# Patient Record
Sex: Female | Born: 1950 | Race: Black or African American | Hispanic: No | Marital: Married | Smoking: Never smoker
Health system: Southern US, Community
[De-identification: ages and names within clinical notes are randomized; demographics above are authoritative.]

## PROBLEM LIST (undated history)

## (undated) DIAGNOSIS — E7800 Pure hypercholesterolemia, unspecified: Secondary | ICD-10-CM

## (undated) DIAGNOSIS — D649 Anemia, unspecified: Secondary | ICD-10-CM

## (undated) DIAGNOSIS — R569 Unspecified convulsions: Secondary | ICD-10-CM

## (undated) DIAGNOSIS — K219 Gastro-esophageal reflux disease without esophagitis: Secondary | ICD-10-CM

## (undated) DIAGNOSIS — I10 Essential (primary) hypertension: Secondary | ICD-10-CM

## (undated) DIAGNOSIS — K759 Inflammatory liver disease, unspecified: Secondary | ICD-10-CM

## (undated) DIAGNOSIS — R51 Headache: Secondary | ICD-10-CM

## (undated) DIAGNOSIS — M1990 Unspecified osteoarthritis, unspecified site: Secondary | ICD-10-CM

## (undated) DIAGNOSIS — M75100 Unspecified rotator cuff tear or rupture of unspecified shoulder, not specified as traumatic: Secondary | ICD-10-CM

## (undated) DIAGNOSIS — E039 Hypothyroidism, unspecified: Secondary | ICD-10-CM

## (undated) HISTORY — PX: ABDOMINAL HYSTERECTOMY: SHX81

## (undated) HISTORY — PX: KNEE ARTHROSCOPY: SHX127

---

## 2013-11-01 ENCOUNTER — Encounter (HOSPITAL_COMMUNITY)
Admission: RE | Admit: 2013-11-01 | Discharge: 2013-11-01 | Disposition: A | Discharge status: Home / Self Care | Payer: Medicaid Other | Source: Ambulatory Visit | Attending: Orthopedic Surgery | Admitting: Orthopedic Surgery

## 2013-11-01 ENCOUNTER — Encounter (HOSPITAL_COMMUNITY): Payer: Self-pay

## 2013-11-01 ENCOUNTER — Ambulatory Visit (HOSPITAL_COMMUNITY)
Admission: RE | Admit: 2013-11-01 | Discharge: 2013-11-01 | Disposition: A | Discharge status: Home / Self Care | Payer: Medicaid Other | Source: Ambulatory Visit | Attending: Surgical | Admitting: Surgical

## 2013-11-01 ENCOUNTER — Encounter (HOSPITAL_COMMUNITY): Payer: Self-pay | Admitting: Pharmacy Technician

## 2013-11-01 ENCOUNTER — Encounter (INDEPENDENT_AMBULATORY_CARE_PROVIDER_SITE_OTHER): Payer: Self-pay

## 2013-11-01 DIAGNOSIS — I10 Essential (primary) hypertension: Secondary | ICD-10-CM | POA: Insufficient documentation

## 2013-11-01 DIAGNOSIS — Z01818 Encounter for other preprocedural examination: Secondary | ICD-10-CM | POA: Insufficient documentation

## 2013-11-01 DIAGNOSIS — Z01810 Encounter for preprocedural cardiovascular examination: Secondary | ICD-10-CM | POA: Insufficient documentation

## 2013-11-01 DIAGNOSIS — Z01812 Encounter for preprocedural laboratory examination: Secondary | ICD-10-CM | POA: Insufficient documentation

## 2013-11-01 HISTORY — DX: Anemia, unspecified: D64.9

## 2013-11-01 HISTORY — DX: Essential (primary) hypertension: I10

## 2013-11-01 HISTORY — DX: Gastro-esophageal reflux disease without esophagitis: K21.9

## 2013-11-01 HISTORY — DX: Unspecified convulsions: R56.9

## 2013-11-01 HISTORY — DX: Inflammatory liver disease, unspecified: K75.9

## 2013-11-01 HISTORY — DX: Unspecified osteoarthritis, unspecified site: M19.90

## 2013-11-01 HISTORY — DX: Unspecified rotator cuff tear or rupture of unspecified shoulder, not specified as traumatic: M75.100

## 2013-11-01 LAB — URINE MICROSCOPIC-ADD ON

## 2013-11-01 LAB — APTT: aPTT: 31 seconds (ref 24–37)

## 2013-11-01 LAB — COMPREHENSIVE METABOLIC PANEL
ALT: 17 U/L (ref 0–35)
AST: 37 U/L (ref 0–37)
Albumin: 3.7 g/dL (ref 3.5–5.2)
Alkaline Phosphatase: 99 U/L (ref 39–117)
BUN: 7 mg/dL (ref 6–23)
CO2: 28 mEq/L (ref 19–32)
Calcium: 10 mg/dL (ref 8.4–10.5)
Chloride: 98 mEq/L (ref 96–112)
Creatinine, Ser: 0.65 mg/dL (ref 0.50–1.10)
GFR calc Af Amer: >90 mL/min (ref >90)
GFR calc non Af Amer: >90 mL/min (ref >90)
Glucose, Bld: 114 mg/dL — ABNORMAL HIGH (ref 70–99)
Potassium: 2.9 mEq/L — ABNORMAL LOW (ref 3.5–5.1)
Sodium: 138 mEq/L (ref 135–145)
Total Bilirubin: 0.4 mg/dL (ref 0.3–1.2)
Total Protein: 8.2 g/dL (ref 6.0–8.3)

## 2013-11-01 LAB — URINALYSIS, ROUTINE W REFLEX MICROSCOPIC
Bilirubin Urine: NEGATIVE
Glucose, UA: NEGATIVE mg/dL
Ketones, ur: NEGATIVE mg/dL
Nitrite: NEGATIVE
Protein, ur: NEGATIVE mg/dL
Specific Gravity, Urine: 1.026 (ref 1.005–1.030)
Urobilinogen, UA: 1 mg/dL (ref 0.0–1.0)
pH: 6 (ref 5.0–8.0)

## 2013-11-01 LAB — CBC
HCT: 43.3 % (ref 36.0–46.0)
MCV: 98 fL (ref 78.0–100.0)
Platelets: 255 10*3/uL (ref 150–400)
RDW: 12.9 % (ref 11.5–15.5)
WBC: 10.8 10*3/uL — ABNORMAL HIGH (ref 4.0–10.5)

## 2013-11-01 LAB — PROTIME-INR
INR: 0.97 (ref 0.00–1.49)
Prothrombin Time: 12.7 seconds (ref 11.6–15.2)

## 2013-11-01 NOTE — Patient Instructions (Addendum)
YOUR SURGERY IS SCHEDULED AT [facility]  ON:  Wednesday  12/3  REPORT TO  SHORT STAY CENTER AT:   12:30 PM      PHONE # FOR SHORT STAY IS [phone]  DO NOT EAT  ANYTHING AFTER MIDNIGHT THE NIGHT BEFORE YOUR SURGERY.   NO FOOD, NO CHEWING GUM, NO MINTS, NO CANDIES, NO CHEWING TOBACCO. YOU MAY HAVE CLEAR LIQUIDS TO DRINK FROM MIDNIGHT UNTIL 8:30 AM THE MORNING OF SURGERY - LIKE WATER AND SODA.   NOTHING TO DRINK AFTER 8:30 AM  PLEASE TAKE THE FOLLOWING MEDICATIONS THE AM OF YOUR SURGERY WITH A FEW SIPS OF WATER:  AMLODIPINE    DO NOT BRING VALUABLES, MONEY, CREDIT CARDS.  DO NOT WEAR JEWELRY, MAKE-UP, NAIL POLISH AND NO METAL PINS OR CLIPS IN YOUR HAIR. CONTACT LENS, DENTURES / PARTIALS, GLASSES SHOULD NOT BE WORN TO SURGERY AND IN MOST CASES-HEARING AIDS WILL NEED TO BE REMOVED.  BRING YOUR GLASSES CASE, ANY EQUIPMENT NEEDED FOR YOUR CONTACT LENS. FOR PATIENTS ADMITTED TO THE HOSPITAL--CHECK OUT TIME THE DAY OF DISCHARGE IS 11:00 AM.  ALL INPATIENT ROOMS ARE PRIVATE - WITH BATHROOM, TELEPHONE, TELEVISION AND WIFI INTERNET.                                                     PLEASE READ OVER ANY  FACT SHEETS THAT YOU WERE GIVEN: MRSA INFORMATION, BLOOD TRANSFUSION INFORMATION, INCENTIVE SPIROMETER INFORMATION.  FAILURE TO FOLLOW THESE INSTRUCTIONS MAY RESULT IN THE CANCELLATION OF YOUR SURGERY. PLEASE BE AWARE THAT YOU MAY NEED ADDITIONAL BLOOD DRAWN DAY OF YOUR SURGERY  PATIENT SIGNATURE_________________________________

## 2013-11-01 NOTE — Pre-Procedure Instructions (Signed)
EKG AND CXR WERE DONE TODAY PREOP AT [facility] PT'S CMET  AND UA AND URINALYSIS AND URINE CULTURE IN PROCESS REPORTS WERE FAXED TO DR. Jeannetta Ellis OFFICE FOR REVIEW OF ABNORMALS -AND WENDY AT THE OFFICE NOTIFIED BY VOICE MAIL OF FAXED LABS AND POTASSIUM OF 2.9.

## 2013-11-02 LAB — URINE CULTURE: Colony Count: 80000

## 2013-11-02 NOTE — Pre-Procedure Instructions (Signed)
URINE CULTURE REPORT BACK - FAXED TO DR. GIOFFRE'S OFFICE.

## 2013-11-02 NOTE — Pre-Procedure Instructions (Signed)
PT CAME FOR HER PREOP VISIT YESTERDAY  11/15 - BUT WAS 45 MIN LATE FOR HER APPOINTMENT - I DID NOT HAVE TIME TO COMPLETE HER HISTORY / ASSESSMENT -JUST HER INSTRUCTIONS/ MEDS/ MEDICAL HISTORY AND OR CONSENT AND SHE GOT ALL OF HER PREOP LABS DONE.  I CALLED HER ON THE PHONE TODAY AND COMPLETED HER ASSESSMENT.

## 2013-11-09 ENCOUNTER — Ambulatory Visit (HOSPITAL_COMMUNITY): Payer: Medicaid Other | Admitting: Certified Registered Nurse Anesthetist

## 2013-11-09 ENCOUNTER — Encounter (HOSPITAL_COMMUNITY)
Admission: RE | Disposition: A | Discharge status: Home / Self Care | Payer: Self-pay | Source: Ambulatory Visit | Attending: Orthopedic Surgery

## 2013-11-09 ENCOUNTER — Encounter (HOSPITAL_COMMUNITY): Payer: Self-pay | Admitting: *Deleted

## 2013-11-09 ENCOUNTER — Encounter (HOSPITAL_COMMUNITY): Payer: Medicaid Other | Admitting: Certified Registered Nurse Anesthetist

## 2013-11-09 ENCOUNTER — Observation Stay (HOSPITAL_COMMUNITY)
Admission: RE | Admit: 2013-11-09 | Discharge: 2013-11-10 | Disposition: A | Discharge status: Home / Self Care | Payer: Medicaid Other | Source: Ambulatory Visit | Attending: Orthopedic Surgery | Admitting: Orthopedic Surgery

## 2013-11-09 DIAGNOSIS — Z79899 Other long term (current) drug therapy: Secondary | ICD-10-CM | POA: Insufficient documentation

## 2013-11-09 DIAGNOSIS — F17200 Nicotine dependence, unspecified, uncomplicated: Secondary | ICD-10-CM | POA: Insufficient documentation

## 2013-11-09 DIAGNOSIS — S43429A Sprain of unspecified rotator cuff capsule, initial encounter: Secondary | ICD-10-CM

## 2013-11-09 DIAGNOSIS — K219 Gastro-esophageal reflux disease without esophagitis: Secondary | ICD-10-CM | POA: Insufficient documentation

## 2013-11-09 DIAGNOSIS — M75120 Complete rotator cuff tear or rupture of unspecified shoulder, not specified as traumatic: Secondary | ICD-10-CM | POA: Diagnosis present

## 2013-11-09 DIAGNOSIS — K759 Inflammatory liver disease, unspecified: Secondary | ICD-10-CM | POA: Insufficient documentation

## 2013-11-09 DIAGNOSIS — M7500 Adhesive capsulitis of unspecified shoulder: Principal | ICD-10-CM | POA: Insufficient documentation

## 2013-11-09 DIAGNOSIS — Y9229 Other specified public building as the place of occurrence of the external cause: Secondary | ICD-10-CM | POA: Insufficient documentation

## 2013-11-09 DIAGNOSIS — Z9889 Other specified postprocedural states: Secondary | ICD-10-CM

## 2013-11-09 DIAGNOSIS — R296 Repeated falls: Secondary | ICD-10-CM | POA: Insufficient documentation

## 2013-11-09 DIAGNOSIS — I10 Essential (primary) hypertension: Secondary | ICD-10-CM | POA: Insufficient documentation

## 2013-11-09 HISTORY — PX: SHOULDER OPEN ROTATOR CUFF REPAIR: SHX2407

## 2013-11-09 LAB — POTASSIUM: Potassium: 3.1 meq/L — ABNORMAL LOW (ref 3.5–5.1)

## 2013-11-09 SURGERY — REPAIR, ROTATOR CUFF, OPEN
Anesthesia: General | Site: Shoulder | Laterality: Right

## 2013-11-09 MED ORDER — NEOSTIGMINE METHYLSULFATE 1 MG/ML IJ SOLN
INTRAMUSCULAR | Status: DC | PRN
  Administered 2013-11-09: 3 mg via INTRAVENOUS

## 2013-11-09 MED ORDER — DEXAMETHASONE SODIUM PHOSPHATE 10 MG/ML IJ SOLN
INTRAMUSCULAR | Status: AC
  Filled 2013-11-09: qty 1

## 2013-11-09 MED ORDER — 0.9 % SODIUM CHLORIDE (POUR BTL) OPTIME
TOPICAL | Status: DC | PRN
  Administered 2013-11-09: 1000 mL

## 2013-11-09 MED ORDER — LIDOCAINE HCL (CARDIAC) 20 MG/ML IV SOLN
INTRAVENOUS | Status: AC
  Filled 2013-11-09: qty 5

## 2013-11-09 MED ORDER — ACETAMINOPHEN 650 MG RE SUPP: 650.0000 mg | Freq: Four times a day (QID) | RECTAL | Status: DC | PRN

## 2013-11-09 MED ORDER — METHOCARBAMOL 500 MG PO TABS
500.0000 mg | ORAL_TABLET | Freq: Four times a day (QID) | ORAL | Status: DC | PRN
  Administered 2013-11-10: 13:00:00 500 mg via ORAL
  Filled 2013-11-09: qty 1

## 2013-11-09 MED ORDER — NEOSTIGMINE METHYLSULFATE 1 MG/ML IJ SOLN
INTRAMUSCULAR | Status: AC
  Filled 2013-11-09: qty 10

## 2013-11-09 MED ORDER — MENTHOL 3 MG MT LOZG: 1.0000 | LOZENGE | OROMUCOSAL | Status: DC | PRN

## 2013-11-09 MED ORDER — MIDAZOLAM HCL 2 MG/2ML IJ SOLN
INTRAMUSCULAR | Status: AC
  Filled 2013-11-09: qty 2

## 2013-11-09 MED ORDER — LIDOCAINE HCL (CARDIAC) 20 MG/ML IV SOLN
INTRAVENOUS | Status: DC | PRN
  Administered 2013-11-09: 50 mg via INTRAVENOUS

## 2013-11-09 MED ORDER — ROCURONIUM BROMIDE 100 MG/10ML IV SOLN
INTRAVENOUS | Status: AC
  Filled 2013-11-09: qty 1

## 2013-11-09 MED ORDER — PHENOL 1.4 % MT LIQD: 1.0000 | OROMUCOSAL | Status: DC | PRN

## 2013-11-09 MED ORDER — SODIUM CHLORIDE 0.9 % IR SOLN
Status: DC | PRN
  Administered 2013-11-09: 16:00:00

## 2013-11-09 MED ORDER — LABETALOL HCL 5 MG/ML IV SOLN
INTRAVENOUS | Status: AC
  Filled 2013-11-09: qty 4

## 2013-11-09 MED ORDER — PHENYLEPHRINE 40 MCG/ML (10ML) SYRINGE FOR IV PUSH (FOR BLOOD PRESSURE SUPPORT)
PREFILLED_SYRINGE | INTRAVENOUS | Status: AC
  Filled 2013-11-09: qty 10

## 2013-11-09 MED ORDER — THROMBIN 5000 UNITS EX SOLR
CUTANEOUS | Status: AC
  Filled 2013-11-09: qty 10000

## 2013-11-09 MED ORDER — BISACODYL 10 MG RE SUPP: 10.0000 mg | Freq: Every day | RECTAL | Status: DC | PRN

## 2013-11-09 MED ORDER — LACTATED RINGERS IV SOLN
INTRAVENOUS | Status: DC
  Administered 2013-11-09: 1000 mL via INTRAVENOUS

## 2013-11-09 MED ORDER — HYDROMORPHONE HCL PF 1 MG/ML IJ SOLN
INTRAMUSCULAR | Status: DC | PRN
  Administered 2013-11-09: 1 mg via INTRAVENOUS
  Administered 2013-11-09 (×3): 0.5 mg via INTRAVENOUS

## 2013-11-09 MED ORDER — LACTATED RINGERS IV SOLN
INTRAVENOUS | Status: DC
  Administered 2013-11-09: 19:00:00 100 mL/h via INTRAVENOUS
  Administered 2013-11-10: via INTRAVENOUS

## 2013-11-09 MED ORDER — FENTANYL CITRATE 0.05 MG/ML IJ SOLN
INTRAMUSCULAR | Status: AC
  Filled 2013-11-09: qty 5

## 2013-11-09 MED ORDER — FENTANYL CITRATE 0.05 MG/ML IJ SOLN
INTRAMUSCULAR | Status: DC | PRN
  Administered 2013-11-09: 75 ug via INTRAVENOUS
  Administered 2013-11-09: 50 ug via INTRAVENOUS
  Administered 2013-11-09: 100 ug via INTRAVENOUS
  Administered 2013-11-09: 25 ug via INTRAVENOUS

## 2013-11-09 MED ORDER — ONDANSETRON HCL 4 MG/2ML IJ SOLN
INTRAMUSCULAR | Status: DC | PRN
  Administered 2013-11-09 (×2): 2 mg via INTRAVENOUS

## 2013-11-09 MED ORDER — ONDANSETRON HCL 4 MG PO TABS: 4.0000 mg | ORAL_TABLET | Freq: Four times a day (QID) | ORAL | Status: DC | PRN

## 2013-11-09 MED ORDER — GLYCOPYRROLATE 0.2 MG/ML IJ SOLN
INTRAMUSCULAR | Status: AC
  Filled 2013-11-09: qty 3

## 2013-11-09 MED ORDER — GLYCOPYRROLATE 0.2 MG/ML IJ SOLN
INTRAMUSCULAR | Status: DC | PRN
  Administered 2013-11-09: 0.4 mg via INTRAVENOUS

## 2013-11-09 MED ORDER — SUCCINYLCHOLINE CHLORIDE 20 MG/ML IJ SOLN
INTRAMUSCULAR | Status: DC | PRN
  Administered 2013-11-09: 100 mg via INTRAVENOUS

## 2013-11-09 MED ORDER — PROPOFOL 10 MG/ML IV BOLUS
INTRAVENOUS | Status: DC | PRN
  Administered 2013-11-09: 200 mg via INTRAVENOUS

## 2013-11-09 MED ORDER — POLYETHYLENE GLYCOL 3350 17 G PO PACK: 17.0000 g | PACK | Freq: Every day | ORAL | Status: DC | PRN

## 2013-11-09 MED ORDER — ONDANSETRON HCL 4 MG/2ML IJ SOLN: 4.0000 mg | Freq: Four times a day (QID) | INTRAMUSCULAR | Status: DC | PRN

## 2013-11-09 MED ORDER — ONDANSETRON HCL 4 MG/2ML IJ SOLN
INTRAMUSCULAR | Status: AC
  Filled 2013-11-09: qty 4

## 2013-11-09 MED ORDER — ACETAMINOPHEN 325 MG PO TABS: 650.0000 mg | ORAL_TABLET | Freq: Four times a day (QID) | ORAL | Status: DC | PRN

## 2013-11-09 MED ORDER — CEFAZOLIN SODIUM-DEXTROSE 2-3 GM-% IV SOLR
INTRAVENOUS | Status: AC
  Filled 2013-11-09: qty 50

## 2013-11-09 MED ORDER — BUPIVACAINE LIPOSOME 1.3 % IJ SUSP
20.0000 mL | Freq: Once | INTRAMUSCULAR | Status: AC
  Administered 2013-11-09: 20 mL
  Filled 2013-11-09: qty 20

## 2013-11-09 MED ORDER — CEFAZOLIN SODIUM-DEXTROSE 2-3 GM-% IV SOLR
2.0000 g | INTRAVENOUS | Status: AC
  Administered 2013-11-09: 2 g via INTRAVENOUS

## 2013-11-09 MED ORDER — THROMBIN 5000 UNITS EX SOLR
CUTANEOUS | Status: DC | PRN
  Administered 2013-11-09: 10000 [IU] via TOPICAL

## 2013-11-09 MED ORDER — GLYCOPYRROLATE 0.2 MG/ML IJ SOLN
INTRAMUSCULAR | Status: AC
  Filled 2013-11-09: qty 1

## 2013-11-09 MED ORDER — FLEET ENEMA 7-19 GM/118ML RE ENEM: 1.0000 | ENEMA | Freq: Once | RECTAL | Status: AC | PRN

## 2013-11-09 MED ORDER — MIDAZOLAM HCL 5 MG/5ML IJ SOLN
INTRAMUSCULAR | Status: DC | PRN
  Administered 2013-11-09: 1 mg via INTRAVENOUS
  Administered 2013-11-09: 0.5 mg via INTRAVENOUS
  Administered 2013-11-09: 1 mg via INTRAVENOUS
  Administered 2013-11-09: 0.5 mg via INTRAVENOUS
  Administered 2013-11-09 (×2): 2 mg via INTRAVENOUS
  Administered 2013-11-09: 1 mg via INTRAVENOUS

## 2013-11-09 MED ORDER — PROPOFOL 10 MG/ML IV BOLUS
INTRAVENOUS | Status: AC
  Filled 2013-11-09: qty 20

## 2013-11-09 MED ORDER — HYDROMORPHONE HCL PF 1 MG/ML IJ SOLN
INTRAMUSCULAR | Status: AC
  Administered 2013-11-09: 0.25 mg via INTRAVENOUS
  Filled 2013-11-09: qty 1

## 2013-11-09 MED ORDER — ONDANSETRON HCL 4 MG/2ML IJ SOLN
INTRAMUSCULAR | Status: AC
  Filled 2013-11-09: qty 2

## 2013-11-09 MED ORDER — LACTATED RINGERS IV SOLN: INTRAVENOUS | Status: DC

## 2013-11-09 MED ORDER — HYDROCODONE-ACETAMINOPHEN 5-325 MG PO TABS
1.0000 | ORAL_TABLET | ORAL | Status: DC | PRN
  Administered 2013-11-09 (×2): 1 via ORAL
  Administered 2013-11-10 (×3): 2 via ORAL
  Filled 2013-11-09 (×3): qty 2
  Filled 2013-11-09 (×2): qty 1

## 2013-11-09 MED ORDER — OXYCODONE-ACETAMINOPHEN 5-325 MG PO TABS: 1.0000 | ORAL_TABLET | ORAL | Status: DC | PRN

## 2013-11-09 MED ORDER — DEXAMETHASONE SODIUM PHOSPHATE 4 MG/ML IJ SOLN
INTRAMUSCULAR | Status: DC | PRN
  Administered 2013-11-09: 10 mg via INTRAVENOUS

## 2013-11-09 MED ORDER — HYDROMORPHONE HCL PF 2 MG/ML IJ SOLN
INTRAMUSCULAR | Status: AC
  Filled 2013-11-09: qty 1

## 2013-11-09 MED ORDER — LABETALOL HCL 5 MG/ML IV SOLN
INTRAVENOUS | Status: DC | PRN
  Administered 2013-11-09: 5 mg via INTRAVENOUS

## 2013-11-09 MED ORDER — ROCURONIUM BROMIDE 100 MG/10ML IV SOLN
INTRAVENOUS | Status: DC | PRN
  Administered 2013-11-09: 30 mg via INTRAVENOUS

## 2013-11-09 MED ORDER — SUCCINYLCHOLINE CHLORIDE 20 MG/ML IJ SOLN
INTRAMUSCULAR | Status: AC
  Filled 2013-11-09: qty 1

## 2013-11-09 MED ORDER — HYDROMORPHONE HCL PF 1 MG/ML IJ SOLN
0.5000 mg | INTRAMUSCULAR | Status: DC | PRN
  Administered 2013-11-09: 0.25 mg via INTRAVENOUS
  Administered 2013-11-09: 0.5 mg via INTRAVENOUS
  Administered 2013-11-10: 01:00:00 1 mg via INTRAVENOUS
  Filled 2013-11-09 (×3): qty 1

## 2013-11-09 MED ORDER — PHENYLEPHRINE HCL 10 MG/ML IJ SOLN
INTRAMUSCULAR | Status: DC | PRN
  Administered 2013-11-09: 40 ug via INTRAVENOUS

## 2013-11-09 MED ORDER — AMLODIPINE BESYLATE 5 MG PO TABS
5.0000 mg | ORAL_TABLET | Freq: Every day | ORAL | Status: DC
Start: 2013-11-10 — End: 2013-11-10
  Administered 2013-11-10: 09:00:00 5 mg via ORAL
  Filled 2013-11-09 (×2): qty 1

## 2013-11-09 MED ORDER — LACTATED RINGERS IV SOLN
INTRAVENOUS | Status: DC | PRN
  Administered 2013-11-09 (×2): via INTRAVENOUS

## 2013-11-09 MED ORDER — CEFAZOLIN SODIUM 1-5 GM-% IV SOLN
1.0000 g | Freq: Four times a day (QID) | INTRAVENOUS | Status: AC
  Administered 2013-11-09 – 2013-11-10 (×3): 1 g via INTRAVENOUS
  Filled 2013-11-09 (×3): qty 50

## 2013-11-09 MED ORDER — METHOCARBAMOL 100 MG/ML IJ SOLN
500.0000 mg | Freq: Four times a day (QID) | INTRAVENOUS | Status: DC | PRN
  Administered 2013-11-09 – 2013-11-10 (×2): 500 mg via INTRAVENOUS
  Filled 2013-11-09 (×2): qty 5

## 2013-11-09 MED ORDER — HYDROMORPHONE HCL PF 1 MG/ML IJ SOLN
0.2500 mg | INTRAMUSCULAR | Status: DC | PRN
  Administered 2013-11-09 (×3): 0.25 mg via INTRAVENOUS

## 2013-11-09 SURGICAL SUPPLY — 45 items
ANCHOR PEEK ZIP 5.5 NDL NO2 (Orthopedic Implant) ×4 IMPLANT
BAG ZIPLOCK 12X15 (MISCELLANEOUS) ×2 IMPLANT
BLADE OSCILLATING/SAGITTAL (BLADE) ×1
BLADE SW THK.38XMED LNG THN (BLADE) ×1 IMPLANT
BNDG COHESIVE 6X5 TAN NS LF (GAUZE/BANDAGES/DRESSINGS) IMPLANT
BUR OVAL CARBIDE 4.0 (BURR) ×2 IMPLANT
CLEANER TIP ELECTROSURG 2X2 (MISCELLANEOUS) ×2 IMPLANT
DERMABOND ADVANCED (GAUZE/BANDAGES/DRESSINGS) ×1
DERMABOND ADVANCED .7 DNX12 (GAUZE/BANDAGES/DRESSINGS) ×1 IMPLANT
DRAPE POUCH INSTRU U-SHP 10X18 (DRAPES) ×2 IMPLANT
DRSG AQUACEL AG ADV 3.5X 6 (GAUZE/BANDAGES/DRESSINGS) ×2 IMPLANT
DURAPREP 26ML APPLICATOR (WOUND CARE) ×2 IMPLANT
ELECT REM PT RETURN 9FT ADLT (ELECTROSURGICAL) ×2
ELECTRODE REM PT RTRN 9FT ADLT (ELECTROSURGICAL) ×1 IMPLANT
GLOVE BIOGEL PI IND STRL 8 (GLOVE) ×1 IMPLANT
GLOVE BIOGEL PI INDICATOR 8 (GLOVE) ×1
GLOVE ECLIPSE 8.0 STRL XLNG CF (GLOVE) ×4 IMPLANT
GLOVE SURG SS PI 6.5 STRL IVOR (GLOVE) ×4 IMPLANT
GOWN PREVENTION PLUS LG XLONG (DISPOSABLE) ×6 IMPLANT
KIT BASIN OR (CUSTOM PROCEDURE TRAY) ×2 IMPLANT
KIT POSITION SHOULDER SCHLEI (MISCELLANEOUS) ×2 IMPLANT
MANIFOLD NEPTUNE II (INSTRUMENTS) ×2 IMPLANT
NEEDLE MA TROC 1/2 (NEEDLE) IMPLANT
NS IRRIG 1000ML POUR BTL (IV SOLUTION) IMPLANT
PACK SHOULDER CUSTOM OPM052 (CUSTOM PROCEDURE TRAY) ×2 IMPLANT
PAD ABD 8X10 STRL (GAUZE/BANDAGES/DRESSINGS) ×4 IMPLANT
PASSER SUT SWANSON 36MM LOOP (INSTRUMENTS) IMPLANT
PATCH TISSUE MEND 3X3CM (Orthopedic Implant) ×2 IMPLANT
POSITIONER SURGICAL ARM (MISCELLANEOUS) ×2 IMPLANT
SLING ARM IMMOBILIZER LRG (SOFTGOODS) ×2 IMPLANT
SLING ARM IMMOBILIZER MED (SOFTGOODS) ×2 IMPLANT
SPONGE SURGIFOAM ABS GEL 100 (HEMOSTASIS) IMPLANT
STAPLER VISISTAT 35W (STAPLE) ×2 IMPLANT
STRIP CLOSURE SKIN 1/2X4 (GAUZE/BANDAGES/DRESSINGS) ×2 IMPLANT
SUCTION FRAZIER 12FR DISP (SUCTIONS) ×2 IMPLANT
SUT BONE WAX W31G (SUTURE) ×2 IMPLANT
SUT ETHIBOND NAB CT1 #1 30IN (SUTURE) ×2 IMPLANT
SUT MNCRL AB 4-0 PS2 18 (SUTURE) ×2 IMPLANT
SUT VIC AB 0 CT1 27 (SUTURE) ×1
SUT VIC AB 0 CT1 27XBRD ANTBC (SUTURE) ×1 IMPLANT
SUT VIC AB 1 CT1 27 (SUTURE) ×2
SUT VIC AB 1 CT1 27XBRD ANTBC (SUTURE) ×2 IMPLANT
SUT VIC AB 2-0 CT1 27 (SUTURE) ×1
SUT VIC AB 2-0 CT1 27XBRD (SUTURE) ×1 IMPLANT
TOWEL OR 17X26 10 PK STRL BLUE (TOWEL DISPOSABLE) ×4 IMPLANT

## 2013-11-09 NOTE — Interval H&P Note (Signed)
History and Physical Interval Note:  11/09/2013 2:18 PM  [name] Aguilar  has presented today for surgery, with the diagnosis of Right Shoulder Rotator Cuff Tear  The various methods of treatment have been discussed with the patient and family. After consideration of risks, benefits and other options for treatment, the patient has consented to  Procedure(s): RIGHT ROTATOR CUFF REPAIR SHOULDER OPEN/CLOSED MANIPULATION/POSSIBLE OPEN ACROMINECTOMY (Right) as a surgical intervention .  The patient's history has been reviewed, patient examined, no change in status, stable for surgery.  I have reviewed the patient's chart and labs.  Questions were answered to the patient's satisfaction.     Kipton Skillen A

## 2013-11-09 NOTE — Brief Op Note (Signed)
11/09/2013  4:37 PM  PATIENT:  [name] Aguilar  62 y.o. female  PRE-OPERATIVE DIAGNOSIS:  Right Shoulder Rotator Cuff Tear.VERY COMPLEX and Frozen Shoulder  POST-OPERATIVE DIAGNOSIS:  Right Shoulder Rotator Cuff Tear,VERY COMPLEX and FROZEN Shoulder.  PROCEDURE:  Closed Manipulation O Frozen Shoulder and Open Acromionectomy and Repair of COMPLEX,Retracted Tear of Right Rotator Cuff and Tissue Mend Graft with Anchors.  SURGEON:  Surgeon(s) and Role:    * Jacki Cones, MD - Primary  PHYSICIAN ASSISTANT: Dimitri Ped PA  ASSISTANTS: Dimitri Ped PA  ANESTHESIA:   general  EBL:  Total I/O In: 1000 [I.V.:1000] Out: 30 [Blood:30]  BLOOD ADMINISTERED:none  DRAINS: none   LOCAL MEDICATIONS USED:  BUPIVICAINE 20cc.  SPECIMEN:  No Specimen  DISPOSITION OF SPECIMEN:  N/A  COUNTS:  YES  TOURNIQUET:  * No tourniquets in log *  DICTATION: .Other Dictation: Dictation Number [id number]  PLAN OF CARE: Admit for overnight observation  PATIENT DISPOSITION:  Stable in OR   Delay start of Pharmacological VTE agent (>24hrs) due to surgical blood loss or risk of bleeding: yes

## 2013-11-09 NOTE — Transfer of Care (Signed)
Immediate Anesthesia Transfer of Care Note  Patient: [name] Aguilar  Procedure(s) Performed: Procedure(s): RIGHT ROTATOR CUFF REPAIR SHOULDER OPEN/CLOSED MANIPULATION/ OPEN ACROMINECTOMY, (Right)  Patient Location: PACU  Anesthesia Type:General  Level of Consciousness: awake, alert , oriented and patient cooperative  Airway & Oxygen Therapy: Patient Spontanous Breathing and Patient connected to face mask oxygen  Post-op Assessment: Report given to PACU RN and Post -op Vital signs reviewed and stable  Post vital signs: stable  Complications: No apparent anesthesia complications

## 2013-11-09 NOTE — Anesthesia Postprocedure Evaluation (Signed)
  Anesthesia Post-op Note  Patient: [name] Aguilar  Procedure(s) Performed: Procedure(s) (LRB): RIGHT ROTATOR CUFF REPAIR SHOULDER OPEN/CLOSED MANIPULATION/ OPEN ACROMINECTOMY, (Right)  Patient Location: PACU  Anesthesia Type: General  Level of Consciousness: awake and alert   Airway and Oxygen Therapy: Patient Spontanous Breathing  Post-op Pain: mild  Post-op Assessment: Post-op Vital signs reviewed, Patient's Cardiovascular Status Stable, Respiratory Function Stable, Patent Airway and No signs of Nausea or vomiting  Last Vitals:  Filed Vitals:   11/09/13 1832  BP: 157/95  Pulse: 74  Temp: 36.6 C  Resp: 12    Post-op Vital Signs: stable   Complications: No apparent anesthesia complications

## 2013-11-09 NOTE — H&P (Signed)
Clemence Lengyel is an 62 y.o. female.   Chief Complaint: right shoulder pain HPI: Gazella presented to Dr. Jeannetta Ellis office accompanied by her son with a chief complaint of an inability to elevate either shoulder. She had an injury where she slipped in a supermarket and fell on the floor and injured both shoulders. This occurred nearly three months ago. She had a normal function of these shoulders before she fell.   Past Medical History  Diagnosis Date  . Hypertension   . Dysrhythmia     PT STATES HX OF SKIPPED BEATS - STATES SAW CARDIOLOGIST YRS AGO-  . GERD (gastroesophageal reflux disease)   . Anemia   . Seizures     YEARS AGO - NONE NOW  . Rotator cuff tear     BILATERAL - SEVERE PAIN BOTH SHOULDERS  . Arthritis     LEGS  . Hepatitis     STATES YELLOW JAUNDICE YRS AGO    Past Surgical History  Procedure Laterality Date  . Cholecystectomy    . Surgery on right knee     Family History Liver Disease, Chronic. sister  Social History:  reports that she has never smoked. Her smokeless tobacco use includes Snuff. She reports that she drinks alcohol. She reports that she does not use illicit drugs.  Allergies: No Known Allergies   Current outpatient prescriptions: amLODipine (NORVASC) 5 MG tablet, Take 5 mg by mouth daily with breakfast., Disp: , Rfl: ;   ergocalciferol (VITAMIN D2) 50000 UNITS capsule, Take 50,000 Units by mouth 2 (two) times a week., Disp: , Rfl: ;   HYDROcodone-acetaminophen (NORCO) 10-325 MG per tablet, Take 1 tablet by mouth every 6 (six) hours as needed for moderate pain or severe pain., Disp: , Rfl:  ibuprofen (ADVIL,MOTRIN) 800 MG tablet, Take 800 mg by mouth every 8 (eight) hours as needed for mild pain or moderate pain., Disp: , Rfl:    Review of Systems  Constitutional: Negative.   HENT: Negative.   Eyes: Positive for photophobia. Negative for blurred vision, double vision, pain, discharge and redness.  Respiratory: Negative.   Cardiovascular:  Negative.   Gastrointestinal: Negative.   Genitourinary: Negative.   Musculoskeletal: Positive for falls and joint pain. Negative for back pain, myalgias and neck pain.       Right shoulder pain  Skin: Negative.   Neurological: Negative.   Endo/Heme/Allergies: Negative for environmental allergies and polydipsia. Bruises/bleeds easily.  Psychiatric/Behavioral: Negative.     Vitals Weight: 168 lb Height: 61 in Body Surface Area: 1.81 m Body Mass Index: 31.74 kg/m BP: 125/86 (Sitting, Left Arm, Standard)  HR: 72 bpm  Physical Exam  Constitutional: She is oriented to person, place, and time. She appears well-developed and well-nourished. No distress.  HENT:  Head: Normocephalic and atraumatic.  Right Ear: External ear normal.  Left Ear: External ear normal.  Nose: Nose normal.  Mouth/Throat: Oropharynx is clear and moist.  Eyes: Conjunctivae and EOM are normal.  Neck: Normal range of motion. Neck supple.  Cardiovascular: Normal rate, regular rhythm, normal heart sounds and intact distal pulses.   No murmur heard. Respiratory: Effort normal and breath sounds normal. No respiratory distress. She has no wheezes.  GI: Soft. Bowel sounds are normal. She exhibits no distension. There is no tenderness.  Musculoskeletal:       Right shoulder: She exhibits decreased range of motion, tenderness, crepitus, pain and decreased strength.       Left shoulder: She exhibits decreased range of motion, tenderness, crepitus, pain  and decreased strength.       Right elbow: Normal.      Left elbow: Normal.       Right wrist: Normal.       Left wrist: Normal.       Cervical back: Normal.  Neurological: She is alert and oriented to person, place, and time. She has normal strength and normal reflexes. No sensory deficit.  Skin: No rash noted. She is not diaphoretic. No erythema.  Psychiatric: She has a normal mood and affect. Her behavior is normal.     Assessment/Plan Right shoulder,  rotator cuff tear She has a complete rupture of both rotator cuff tendon tears. The right is 3 cm with 3.5 cm retraction. No rotator cuff atrophy. The left shoulder shows a complete tear as well measuring 4.5 cm tendon retraction with mild atrophy. The biceps tendon is torn as well. She needs an open right shoulder rotator cuff repair with graft and anchors as well as acromionectomy. We may or may not need to use a graft material that is made from calf skin. This is approved by the FDA and I have not had a rejection of that graft yet. Also, we may need to use anchors. These are polyethylene anchors that stay in the bone and we use those anchors to suture the tendon down in certain cases where the tendon is completely pulled off the bone. There is always a chance of a secondary infection obviously with any surgery but we do use antibiotics preop.  Clorinda Wyble LAUREN 11/09/2013, 8:32 AM

## 2013-11-09 NOTE — Anesthesia Preprocedure Evaluation (Addendum)
Anesthesia Evaluation  Patient identified by MRN, date of birth, ID band Patient awake    Reviewed: Allergy & Precautions, H&P , NPO status , Patient's Chart, lab work & pertinent test results  Airway Mallampati: II TM Distance: >3 FB Neck ROM: full    Dental  (+) Edentulous Upper, Edentulous Lower and Dental Advisory Given   Pulmonary neg pulmonary ROS,  breath sounds clear to auscultation  Pulmonary exam normal       Cardiovascular Exercise Tolerance: Good hypertension, Pt. on medications Rhythm:regular Rate:Normal     Neuro/Psych Seizures -, Well Controlled,  Remote seizures negative neurological ROS  negative psych ROS   GI/Hepatic negative GI ROS, Neg liver ROS, GERD-  Controlled,(+) Hepatitis -, UnspecifiedRemote jaundice   Endo/Other  negative endocrine ROS  Renal/GU negative Renal ROS  negative genitourinary   Musculoskeletal   Abdominal   Peds  Hematology negative hematology ROS (+)   Anesthesia Other Findings   Reproductive/Obstetrics negative OB ROS                          Anesthesia Physical Anesthesia Plan  ASA: II  Anesthesia Plan: General   Post-op Pain Management:    Induction:   Airway Management Planned: Oral ETT  Additional Equipment:   Intra-op Plan:   Post-operative Plan: Extubation in OR  Informed Consent: I have reviewed the patients History and Physical, chart, labs and discussed the procedure including the risks, benefits and alternatives for the proposed anesthesia with the patient or authorized representative who has indicated his/her understanding and acceptance.   Dental Advisory Given  Plan Discussed with: CRNA and Surgeon  Anesthesia Plan Comments:         Anesthesia Quick Evaluation

## 2013-11-10 ENCOUNTER — Encounter (HOSPITAL_COMMUNITY): Payer: Self-pay | Admitting: Orthopedic Surgery

## 2013-11-10 MED ORDER — METHOCARBAMOL 500 MG PO TABS: 500.0000 mg | ORAL_TABLET | Freq: Four times a day (QID) | ORAL | Status: DC | PRN

## 2013-11-10 MED ORDER — OXYCODONE HCL 5 MG PO TABS: 5.0000 mg | ORAL_TABLET | ORAL | Status: DC | PRN

## 2013-11-10 NOTE — Progress Notes (Signed)
Subjective: 1 Day Post-Op Procedure(s) (LRB): RIGHT ROTATOR CUFF REPAIR SHOULDER OPEN/CLOSED MANIPULATION/ OPEN ACROMINECTOMY, (Right) Patient reports pain as 4 on 0-10 scale.  Doing well. Will DC  Objective: Vital signs in last 24 hours: Temp:  [97.6 F (36.4 C)-98 F (36.7 C)] 98 F (36.7 C) (12/04 0556) Pulse Rate:  [66-79] 70 (12/04 0556) Resp:  [10-16] 16 (12/04 0556) BP: (136-166)/(82-103) 148/83 mmHg (12/04 0556) SpO2:  [92 %-99 %] 92 % (12/04 0556) Weight:  [73.936 kg (163 lb)] 73.936 kg (163 lb) (12/03 2042)  Intake/Output from previous day: 12/03 0701 - 12/04 0700 In: 2410 [I.V.:2310; IV Piggyback:100] Out: 430 [Urine:400; Blood:30] Intake/Output this shift:    No results found for this basename: HGB,  in the last 72 hours No results found for this basename: WBC, RBC, HCT, PLT,  in the last 72 hours  Recent Labs  11/09/13 1238  K 3.1*   No results found for this basename: LABPT, INR,  in the last 72 hours  Neurovascular intact  Assessment/Plan: 1 Day Post-Op Procedure(s) (LRB): RIGHT ROTATOR CUFF REPAIR SHOULDER OPEN/CLOSED MANIPULATION/ OPEN ACROMINECTOMY, (Right) Discharge home with home health  Amanpreet Delmont A 11/10/2013, 7:21 AM

## 2013-11-10 NOTE — Progress Notes (Signed)
Pt stable, scripts, d/c instructions given with no questions/concerns voiced by pt or family.  Pt transported via wheelchair to private vehicle by NT and family. 

## 2013-11-10 NOTE — Discharge Summary (Signed)
Physician Discharge Summary  Patient ID: [name] Aguilar MRN: [id number] DOB/AGE: 09/05/1951 62 y.o.  Admit date: 11/09/2013 Discharge date: 11/10/2013  Admission Diagnoses:Frozen Shoulder and Torn Rotator Cuff  Discharge Diagnoses:Frozen Shoulder and Torn Rotator Cuff.  Active Problems:   Rotator cuff (capsule) sprain   Complete rotator cuff tear   Discharged Condition: good  Hospital Course: No post-op problems  Consults: OT  Significant Diagnostic Studies: None  Treatments: antibiotics: Ancef  Discharge Exam: Blood pressure 148/83, pulse 70, temperature 98 F (36.7 C), temperature source Oral, resp. rate 16, height 5\' 3"  (1.6 m), weight 73.936 kg (163 lb), SpO2 92.00%. Extremities: Good circulation in Right Hand.  Disposition: Final discharge disposition not confirmed     Medication List    ASK your doctor about these medications       amLODipine 5 MG tablet  Commonly known as:  NORVASC  Take 5 mg by mouth daily with breakfast.     ergocalciferol 50000 UNITS capsule  Commonly known as:  VITAMIN D2  Take 50,000 Units by mouth 2 (two) times a week.     HYDROcodone-acetaminophen 10-325 MG per tablet  Commonly known as:  NORCO  Take 1 tablet by mouth every 6 (six) hours as needed for moderate pain or severe pain.     ibuprofen 800 MG tablet  Commonly known as:  ADVIL,MOTRIN  Take 800 mg by mouth every 8 (eight) hours as needed for mild pain or moderate pain.         Signed: Lakiesha Ralphs A 11/10/2013, 7:22 AM

## 2013-11-10 NOTE — Evaluation (Signed)
Occupational Therapy Evaluation Patient Details Name: [name] Aguilar MRN: [id number] DOB: [birth year] Today's Date: 11/10/2013 Time: 7425-9563 OT Time Calculation (min): 27 min  OT Assessment / Plan / Recommendation History of present illness pt sustained a fall resulting in bil RCTs.  L RCR was done for complex tear and frozen shoulder   Clinical Impression   Pt was admitted for the above surgery.  Plan to return later today when son is present to review education and have him practice donning sling.      OT Assessment  Patient needs continued OT Services    Follow Up Recommendations   (pt will follow up with dr Darrelyn Hillock for further rehab)    Barriers to Discharge      Equipment Recommendations   (may want to consider shower seat)    Recommendations for Other Services    Frequency  Min 2X/week    Precautions / Restrictions Precautions Precautions: Shoulder Type of Shoulder Precautions: sling on x bathing/dressing.  No shoulder ROM Restrictions Weight Bearing Restrictions: Yes Other Position/Activity Restrictions: NWB   Pertinent Vitals/Pain 5/10 R shoulder.  Repositioned and applied ice    ADL  Eating/Feeding: Set up Where Assessed - Eating/Feeding: Bed level Grooming: Minimal assistance (assist for hair) Where Assessed - Grooming: Unsupported sitting Upper Body Bathing: Maximal assistance Where Assessed - Upper Body Bathing: Unsupported sitting Lower Body Bathing: Moderate assistance Where Assessed - Lower Body Bathing: Unsupported sit to stand Upper Body Dressing: Maximal assistance Where Assessed - Upper Body Dressing: Unsupported sitting Lower Body Dressing: Maximal assistance Where Assessed - Lower Body Dressing: Unsupported sit to stand Toilet Transfer: Supervision/safety Toilet Transfer Method: Sit to Barista: Comfort height toilet Toileting - Clothing Manipulation and Hygiene: Set up Where Assessed - Engineer, mining  and Hygiene: Sit on 3-in-1 or toilet;Lean right and/or left Transfers/Ambulation Related to ADLs: ambulated to bathroom to use commode. ADL Comments: Adjusted sling and reviewed education with pt.  Son will be here later and will educate on UB adls; granddaughter will assist and he can show her.      OT Diagnosis: Generalized weakness  OT Problem List: Decreased strength;Decreased range of motion;Pain;Impaired UE functional use OT Treatment Interventions: Self-care/ADL training;DME and/or AE instruction;Balance training   OT Goals(Current goals can be found in the care plan section) Acute Rehab OT Goals Patient Stated Goal: be as independent as possible OT Goal Formulation: With patient Time For Goal Achievement: 11/17/13 Potential to Achieve Goals: Good ADL Goals Additional ADL Goal #1: family will verbalize understanding of shoulder precautions/protocol Additional ADL Goal #2: family will don sling with supervision  Visit Information  Last OT Received On: 11/10/13 Assistance Needed: +1 History of Present Illness: pt sustained a fall resulting in bil RCTs.  L RCR was done for complex tear and frozen shoulder       Prior Functioning     Home Living Family/patient expects to be discharged to:: Private residence Living Arrangements: Children;Other relatives Type of Home: House Home Equipment: None Prior Function Level of Independence: Needs assistance Comments: pt is very independent but granddaughter lives with her as helps prn Communication Communication:  (very difficult to understand--no teeth) Dominant Hand: Right (but uses L for most adls now)         Vision/Perception     Cognition  Cognition Arousal/Alertness: Awake/alert Behavior During Therapy: WFL for tasks assessed/performed Overall Cognitive Status: Within Functional Limits for tasks assessed    Extremity/Trunk Assessment Upper Extremity Assessment Upper Extremity Assessment: RUE deficits/detail;LUE  deficits/detail RUE Deficits / Details: immobilized due to sx; fingers wfls LUE Deficits / Details: RCT.  limited shoulder ROM and bicep tendon involved.  Distally wfls:  pt uses this as primary hand     Mobility Bed Mobility Bed Mobility: Sit to Supine;Supine to Sit Supine to Sit: 5: Supervision;HOB elevated Sit to Supine: 5: Supervision;HOB elevated Details for Bed Mobility Assistance: extra time Transfers Transfers: Sit to Stand;Stand to Sit Sit to Stand: 5: Supervision Stand to Sit: 5: Supervision     Exercise     Balance     End of Session OT - End of Session Activity Tolerance: Patient tolerated treatment well Patient left: in bed;with call bell/phone within reach  GO Functional Assessment Tool Used: clinical judgment and observation Functional Limitation: Self care Self Care Current Status (B1478): At least 60 percent but less than 80 percent impaired, limited or restricted Self Care Goal Status (G9562): At least 60 percent but less than 80 percent impaired, limited or restricted   [facility] 11/10/2013, 10:43 AM Marica Otter, OTR/L [phone] 11/10/2013

## 2013-11-10 NOTE — Progress Notes (Signed)
11/10/13 1300  OT Visit Information  Last OT Received On 11/10/13  Assistance Needed +1  History of Present Illness pt sustained a fall resulting in bil RCTs.  L RCR was done for complex tear and frozen shoulder  OT Time Calculation  OT Start Time 1236  OT Stop Time 1309  OT Time Calculation (min) 33 min  Precautions  Precautions Shoulder  Type of Shoulder Precautions sling on x bathing/dressing.  No shoulder ROM  Restrictions  Weight Bearing Restrictions Yes  Other Position/Activity Restrictions NWB  ADL  Lower Body Dressing Moderate assistance  Where Assessed - Lower Body Dressing Unsupported sit to stand  Toilet Transfer Supervision/safety  Toilet Transfer Method Sit to stand  Toilet Transfer Equipment Comfort height toilet  Transfers/Ambulation Related to ADLs supervision to ambulate to bathroom  ADL Comments family present:  brother and daughter-in-law.  Reviewed shoulder protocol with them and demonstrated adjusting sling.  Attempted to put shirt on pt, but she could not tolerate due to pain.  Had taken sling off, supported arm but pain too great as she held arm tightly against ribcage.  Replaced sling and verbally reviewed steps with family after this was done.  Pt will likely wear a shirt over sling.  We zipped robe around sling.  Pt could not tolerate having gauze removed from between upper arm and ribcage.  She did not want to wash UB--recommended that they use washcloth with method shown (seesaw) to loosen dressing and remove.  Family verbalizes understanding of education and protocol sheet.  Recommended reacher to increase independence with LB adls.  Pain 10/10 when sling removed; requested pain meds and applied ice  Cognition  Arousal/Alertness Awake/alert  Behavior During Therapy WFL for tasks assessed/performed  Overall Cognitive Status Within Functional Limits for tasks assessed  Transfers  Sit to Stand 5: Supervision;From bed;From toilet  Stand to Sit 5: Supervision;To  bed;To toilet  OT - End of Session  Activity Tolerance Patient tolerated treatment well  Patient left in bed;with call bell/phone within reach  OT Assessment/Plan  Follow Up Recommendations (pt will follow up with dr Darrelyn Hillock for further rehab)  OT Equipment (pt is considering tub seat; also recommended reacher)  OT Goal Progression  Progress towards OT goals Progressing toward goals (family unable to practice sling but verbalizes)  OT G-codes **NOT FOR INPATIENT CLASS**  Functional Assessment Tool Used clinical judgment and observation  Functional Limitation Self care  Self Care Discharge Status ([id number]) CL  OT General Charges  $OT Visit 1 Procedure  OT Treatments  $Self Care/Home Management  23-37 mins  Marica Otter, OTR/L [phone] 11/10/2013

## 2013-11-10 NOTE — Op Note (Signed)
NAMEKIM, OKI NO.:  [id number]  MEDICAL RECORD NO.:  [id number]  LOCATION:  1611                         FACILITY:  [facility]  PHYSICIAN:  Georges Lynch. Debora Stockdale, M.D.DATE OF BIRTH:  10/10/1951  DATE OF PROCEDURE:  11/09/2013 DATE OF DISCHARGE:                              OPERATIVE REPORT   SURGEON:  Georges Lynch. Darrelyn Hillock, MD.  ASSISTANT:  Dimitri Ped, PA  PREOPERATIVE DIAGNOSES: 1. Frozen shoulder, right shoulder. 2. Severe complex complete retracted tear of the right rotator cuff.  POSTOPERATIVE DIAGNOSES: 1. Frozen shoulder, right shoulder. 2. Severe complex complete retracted tear of the right rotator cuff.  OPERATION: 1. Closed manipulation of the right shoulder. 2. Open acromionectomy and acromioplasty, right shoulder. 3. Repair of a complex, retracted complete tear of the right rotator     cuff. 4. TissueMend graft used for the repair, reinforcement as well as 2     anchors.  PROCEDURE:  Under general anesthesia, routine orthopedic prep and draping of the right upper extremity was carried out.  Note, a first time out was carried out prior to me doing the closed manipulation of the right shoulder.  I also marked the appropriate right shoulder in the holding area.  After this was done, we then went out and scrubbed, came back, re scrubbed, came back and did a second time-out before we did the second procedure which was an open repair of the: 1. Right rotator cuff. 2. Open acromionectomy and acromioplasty, right shoulder. 3. TissueMend graft. 4. Two Stryker anchors used for the reinforcement and repair.  Under general anesthesia, routine orthopedic prep and draping, as I mentioned was carried out in the right upper extremity.  Appropriate time-out as I mentioned was carried out that is the second time-out for an open the shoulder.  I also marked the appropriate right arm in the holding area.  The patient had 2 g of IV Ancef.  At this time,  incision was made over the anterior aspect of the right shoulder.  Bleeders were identified and cauterized.  I then separated the deltoid tendon from the acromion and split the proximal part of the deltoid muscle in usual fashion.  Following that, we noticed quite a bit of venous bleeding which we cauterized and we went along.  She had severe impingement syndrome.  I inserted the Bennett retractor and utilized the oscillating saw and did an open acromionectomy and then utilized a burr for acromioplasty.  Once we had good visualization, I then removed the subdeltoid bursa.  I then identified the rotator cuff that was present. I had to utilize a gloved finger to free it up all the way back posteriorly.  I then utilized a Kocher clamp and brought the cuff forward.  I then inserted 2 Stryker anchors in the proximal humerus. One of the anchor sutures was used to bring up through the rotator cuff and bring it back down into this position.  At that particular point, we then elected to utilize a TissueMend graft with the remaining suture in the anchors.  We had a nice reinforcement.  I then went on and bone waxed the undersurface of the acromion.  I irrigated the area out  and loosely applied some thrombin-soaked Gelfoam in the subacromial space. The remaining part of the wound then was closed in usual fashion.  We injected 20 mL of Exparel into the soft tissue.  Note, this was an extremely complex procedure.          ______________________________ Georges Lynch. Darrelyn Hillock, M.D.     RAG/MEDQ  D:  11/09/2013  T:  11/10/2013  Job:  [id number]

## 2014-02-08 NOTE — Progress Notes (Signed)
Need orders in EPIC.  Surgery on 02/15/14.  Thank You.

## 2014-02-13 ENCOUNTER — Encounter (HOSPITAL_COMMUNITY): Payer: Self-pay | Admitting: Pharmacy Technician

## 2014-02-14 ENCOUNTER — Encounter (HOSPITAL_COMMUNITY): Payer: Self-pay

## 2014-02-14 ENCOUNTER — Encounter (HOSPITAL_COMMUNITY)
Admission: RE | Admit: 2014-02-14 | Discharge: 2014-02-14 | Disposition: A | Discharge status: Home / Self Care | Payer: Medicaid Other | Source: Ambulatory Visit | Attending: Orthopedic Surgery | Admitting: Orthopedic Surgery

## 2014-02-14 HISTORY — DX: Headache: R51

## 2014-02-14 HISTORY — DX: Pure hypercholesterolemia, unspecified: E78.00

## 2014-02-14 HISTORY — DX: Hypothyroidism, unspecified: E03.9

## 2014-02-14 LAB — COMPREHENSIVE METABOLIC PANEL
ALT: 12 U/L (ref 0–35)
AST: 25 U/L (ref 0–37)
Albumin: 3.7 g/dL (ref 3.5–5.2)
Alkaline Phosphatase: 120 U/L — ABNORMAL HIGH (ref 39–117)
BUN: 7 mg/dL (ref 6–23)
CO2: 30 mEq/L (ref 19–32)
Calcium: 9.8 mg/dL (ref 8.4–10.5)
Chloride: 96 mEq/L (ref 96–112)
Creatinine, Ser: 0.64 mg/dL (ref 0.50–1.10)
GFR calc Af Amer: >90 mL/min (ref >90)
GFR calc non Af Amer: >90 mL/min (ref >90)
Glucose, Bld: 116 mg/dL — ABNORMAL HIGH (ref 70–99)
Potassium: 3.4 mEq/L — ABNORMAL LOW (ref 3.7–5.3)
Sodium: 139 mEq/L (ref 137–147)
Total Bilirubin: 0.4 mg/dL (ref 0.3–1.2)
Total Protein: 8 g/dL (ref 6.0–8.3)

## 2014-02-14 LAB — CBC
HEMATOCRIT: 41.7 % (ref 36.0–46.0)
Hemoglobin: 14.5 g/dL (ref 12.0–15.0)
MCH: 32.7 pg (ref 26.0–34.0)
MCHC: 34.8 g/dL (ref 30.0–36.0)
MCV: 94.1 fL (ref 78.0–100.0)
Platelets: 268 10*3/uL (ref 150–400)
RBC: 4.43 MIL/uL (ref 3.87–5.11)
RDW: 13.9 % (ref 11.5–15.5)
WBC: 12.5 10*3/uL — ABNORMAL HIGH (ref 4.0–10.5)

## 2014-02-14 LAB — URINALYSIS, ROUTINE W REFLEX MICROSCOPIC
Glucose, UA: NEGATIVE mg/dL
Ketones, ur: NEGATIVE mg/dL
Nitrite: NEGATIVE
Protein, ur: NEGATIVE mg/dL
Specific Gravity, Urine: 1.022 (ref 1.005–1.030)
Urobilinogen, UA: 1 mg/dL (ref 0.0–1.0)
pH: 5.5 (ref 5.0–8.0)

## 2014-02-14 LAB — URINE MICROSCOPIC-ADD ON

## 2014-02-14 LAB — PROTIME-INR
INR: 0.95 (ref 0.00–1.49)
Prothrombin Time: 12.5 seconds (ref 11.6–15.2)

## 2014-02-14 LAB — APTT: aPTT: 33 seconds (ref 24–37)

## 2014-02-14 LAB — SURGICAL PCR SCREEN
MRSA, PCR: NEGATIVE
STAPHYLOCOCCUS AUREUS: NEGATIVE

## 2014-02-14 LAB — ABO/RH: ABO/RH(D): O POS

## 2014-02-14 NOTE — Progress Notes (Signed)
EKG 11/11/13 on EPIC, Chest x-ray 11/01/13 on EPIC

## 2014-02-14 NOTE — Patient Instructions (Addendum)
20 Juleen ChinaJanie Spath  02/14/2014   Your procedure is scheduled on: 02/15/14  [facility].  Call this number if you have problems the morning of surgery 336-: [phone]   Remember:   Do not eat food or drink liquids After Midnight.     Take these medicines the morning of surgery with A SIP OF WATER: levothyroxin, atorvastain, gabapentin, amlodipine   Do not wear jewelry, make-up or nail polish.  Do not wear lotions, powders, or perfumes. You may wear deodorant.  Do not shave 48 hours prior to surgery. Men may shave face and neck.  Do not bring valuables to the hospital.  Contacts, dentures or bridgework may not be worn into surgery.  Leave suitcase in the car. After surgery it may be brought to your room.  For patients admitted to the hospital, checkout time is 11:00 AM the day of discharge.    Please read over the following fact sheets that you were given:Regan preparing for surgery sheet, MRSA Information, incentive spirometry fact sheet, blood fact sheet Birdie Sonsachel Trea Latner, RN  pre op nurse call if needed [phone]    FAILURE TO FOLLOW THESE INSTRUCTIONS MAY RESULT IN CANCELLATION OF YOUR SURGERY   Patient Signature: ___________________________________________

## 2014-02-15 ENCOUNTER — Inpatient Hospital Stay (HOSPITAL_COMMUNITY)
Admission: RE | Admit: 2014-02-15 | Discharge: 2014-02-18 | DRG: 470 | Disposition: A | Discharge status: Home Health | Payer: Medicaid Other | Source: Ambulatory Visit | Attending: Orthopedic Surgery | Admitting: Orthopedic Surgery

## 2014-02-15 ENCOUNTER — Encounter (HOSPITAL_COMMUNITY): Payer: Medicaid Other | Admitting: *Deleted

## 2014-02-15 ENCOUNTER — Encounter (HOSPITAL_COMMUNITY)
Admission: RE | Disposition: A | Discharge status: Home Health | Payer: Self-pay | Source: Ambulatory Visit | Attending: Orthopedic Surgery

## 2014-02-15 ENCOUNTER — Encounter (HOSPITAL_COMMUNITY): Payer: Self-pay | Admitting: *Deleted

## 2014-02-15 ENCOUNTER — Inpatient Hospital Stay (HOSPITAL_COMMUNITY): Payer: Medicaid Other | Admitting: *Deleted

## 2014-02-15 ENCOUNTER — Inpatient Hospital Stay (HOSPITAL_COMMUNITY): Payer: Medicaid Other

## 2014-02-15 DIAGNOSIS — M948X9 Other specified disorders of cartilage, unspecified sites: Secondary | ICD-10-CM | POA: Diagnosis present

## 2014-02-15 DIAGNOSIS — Z01812 Encounter for preprocedural laboratory examination: Secondary | ICD-10-CM

## 2014-02-15 DIAGNOSIS — Z79899 Other long term (current) drug therapy: Secondary | ICD-10-CM

## 2014-02-15 DIAGNOSIS — M898X9 Other specified disorders of bone, unspecified site: Secondary | ICD-10-CM | POA: Diagnosis present

## 2014-02-15 DIAGNOSIS — M1710 Unilateral primary osteoarthritis, unspecified knee: Principal | ICD-10-CM | POA: Diagnosis present

## 2014-02-15 DIAGNOSIS — E7800 Pure hypercholesterolemia, unspecified: Secondary | ICD-10-CM | POA: Diagnosis present

## 2014-02-15 DIAGNOSIS — M1712 Unilateral primary osteoarthritis, left knee: Secondary | ICD-10-CM | POA: Diagnosis present

## 2014-02-15 DIAGNOSIS — E039 Hypothyroidism, unspecified: Secondary | ICD-10-CM | POA: Diagnosis present

## 2014-02-15 DIAGNOSIS — Z96659 Presence of unspecified artificial knee joint: Secondary | ICD-10-CM

## 2014-02-15 DIAGNOSIS — K219 Gastro-esophageal reflux disease without esophagitis: Secondary | ICD-10-CM | POA: Diagnosis present

## 2014-02-15 DIAGNOSIS — Z791 Long term (current) use of non-steroidal anti-inflammatories (NSAID): Secondary | ICD-10-CM

## 2014-02-15 DIAGNOSIS — M24569 Contracture, unspecified knee: Secondary | ICD-10-CM | POA: Diagnosis present

## 2014-02-15 DIAGNOSIS — I10 Essential (primary) hypertension: Secondary | ICD-10-CM | POA: Diagnosis present

## 2014-02-15 DIAGNOSIS — D62 Acute posthemorrhagic anemia: Secondary | ICD-10-CM | POA: Diagnosis not present

## 2014-02-15 HISTORY — PX: TOTAL KNEE ARTHROPLASTY: SHX125

## 2014-02-15 LAB — TYPE AND SCREEN
ABO/RH(D): O POS
Antibody Screen: NEGATIVE

## 2014-02-15 SURGERY — ARTHROPLASTY, KNEE, TOTAL
Anesthesia: General | Site: Knee | Laterality: Left

## 2014-02-15 MED ORDER — ONDANSETRON HCL 4 MG/2ML IJ SOLN: 4.0000 mg | Freq: Four times a day (QID) | INTRAMUSCULAR | Status: DC | PRN

## 2014-02-15 MED ORDER — ROCURONIUM BROMIDE 100 MG/10ML IV SOLN
INTRAVENOUS | Status: DC | PRN
  Administered 2014-02-15: 5 mg via INTRAVENOUS

## 2014-02-15 MED ORDER — POLYETHYLENE GLYCOL 3350 17 G PO PACK: 17.0000 g | PACK | Freq: Every day | ORAL | Status: DC | PRN

## 2014-02-15 MED ORDER — FENTANYL CITRATE 0.05 MG/ML IJ SOLN
INTRAMUSCULAR | Status: AC
  Filled 2014-02-15: qty 5

## 2014-02-15 MED ORDER — CELECOXIB 200 MG PO CAPS
200.0000 mg | ORAL_CAPSULE | Freq: Two times a day (BID) | ORAL | Status: DC
  Administered 2014-02-15 – 2014-02-18 (×6): 200 mg via ORAL
  Filled 2014-02-15 (×7): qty 1

## 2014-02-15 MED ORDER — SUCCINYLCHOLINE CHLORIDE 20 MG/ML IJ SOLN
INTRAMUSCULAR | Status: DC | PRN
  Administered 2014-02-15: 100 mg via INTRAVENOUS

## 2014-02-15 MED ORDER — HYDRALAZINE HCL 20 MG/ML IJ SOLN
INTRAMUSCULAR | Status: AC
  Filled 2014-02-15: qty 1

## 2014-02-15 MED ORDER — HYDROMORPHONE HCL PF 1 MG/ML IJ SOLN
INTRAMUSCULAR | Status: DC | PRN
  Administered 2014-02-15 (×2): 1 mg via INTRAVENOUS

## 2014-02-15 MED ORDER — METHOCARBAMOL 500 MG PO TABS
500.0000 mg | ORAL_TABLET | Freq: Four times a day (QID) | ORAL | Status: DC | PRN
  Administered 2014-02-16 – 2014-02-17 (×2): 500 mg via ORAL
  Filled 2014-02-15 (×2): qty 1

## 2014-02-15 MED ORDER — LACTATED RINGERS IV SOLN
INTRAVENOUS | Status: DC
  Administered 2014-02-15: 1000 mL via INTRAVENOUS

## 2014-02-15 MED ORDER — PHENYLEPHRINE HCL 10 MG/ML IJ SOLN
INTRAMUSCULAR | Status: DC | PRN
  Administered 2014-02-15: 60 ug via INTRAVENOUS

## 2014-02-15 MED ORDER — PROMETHAZINE HCL 25 MG/ML IJ SOLN: 6.2500 mg | INTRAMUSCULAR | Status: DC | PRN

## 2014-02-15 MED ORDER — SODIUM CHLORIDE 0.9 % IR SOLN
Status: DC | PRN
  Administered 2014-02-15: 1000 mL

## 2014-02-15 MED ORDER — LEVOTHYROXINE SODIUM 75 MCG PO TABS
75.0000 ug | ORAL_TABLET | Freq: Every day | ORAL | Status: DC
  Administered 2014-02-16 – 2014-02-18 (×3): 75 ug via ORAL
  Filled 2014-02-15 (×4): qty 1

## 2014-02-15 MED ORDER — PROPOFOL 10 MG/ML IV BOLUS
INTRAVENOUS | Status: DC | PRN
  Administered 2014-02-15: 180 mg via INTRAVENOUS

## 2014-02-15 MED ORDER — ATORVASTATIN CALCIUM 80 MG PO TABS
80.0000 mg | ORAL_TABLET | Freq: Every day | ORAL | Status: DC
  Administered 2014-02-15 – 2014-02-17 (×3): 80 mg via ORAL
  Filled 2014-02-15 (×4): qty 1

## 2014-02-15 MED ORDER — MIDAZOLAM HCL 2 MG/2ML IJ SOLN
INTRAMUSCULAR | Status: AC
  Filled 2014-02-15: qty 2

## 2014-02-15 MED ORDER — ONDANSETRON HCL 4 MG/2ML IJ SOLN
INTRAMUSCULAR | Status: AC
  Filled 2014-02-15: qty 2

## 2014-02-15 MED ORDER — AMLODIPINE BESYLATE 5 MG PO TABS
5.0000 mg | ORAL_TABLET | Freq: Every day | ORAL | Status: DC
  Administered 2014-02-16 – 2014-02-18 (×3): 5 mg via ORAL
  Filled 2014-02-15 (×3): qty 1

## 2014-02-15 MED ORDER — LABETALOL HCL 5 MG/ML IV SOLN
INTRAVENOUS | Status: DC | PRN
  Administered 2014-02-15 (×2): 2.5 mg via INTRAVENOUS
  Administered 2014-02-15: 5 mg via INTRAVENOUS
  Administered 2014-02-15: 2.5 mg via INTRAVENOUS

## 2014-02-15 MED ORDER — FERROUS SULFATE 325 (65 FE) MG PO TABS
325.0000 mg | ORAL_TABLET | Freq: Three times a day (TID) | ORAL | Status: DC
  Administered 2014-02-15 – 2014-02-18 (×9): 325 mg via ORAL
  Filled 2014-02-15 (×11): qty 1

## 2014-02-15 MED ORDER — CEFAZOLIN SODIUM-DEXTROSE 2-3 GM-% IV SOLR
INTRAVENOUS | Status: AC
  Filled 2014-02-15: qty 50

## 2014-02-15 MED ORDER — MIDAZOLAM HCL 5 MG/5ML IJ SOLN
INTRAMUSCULAR | Status: DC | PRN
  Administered 2014-02-15 (×2): 1 mg via INTRAVENOUS

## 2014-02-15 MED ORDER — ONDANSETRON HCL 4 MG PO TABS
4.0000 mg | ORAL_TABLET | Freq: Four times a day (QID) | ORAL | Status: DC | PRN
Start: 2014-02-15 — End: 2014-02-18

## 2014-02-15 MED ORDER — GABAPENTIN 300 MG PO CAPS
300.0000 mg | ORAL_CAPSULE | Freq: Three times a day (TID) | ORAL | Status: DC
  Administered 2014-02-15 – 2014-02-18 (×9): 300 mg via ORAL
  Filled 2014-02-15 (×11): qty 1

## 2014-02-15 MED ORDER — THROMBIN 5000 UNITS EX SOLR
CUTANEOUS | Status: AC
  Filled 2014-02-15: qty 5000

## 2014-02-15 MED ORDER — THROMBIN 5000 UNITS EX SOLR
CUTANEOUS | Status: DC | PRN
  Administered 2014-02-15: 5000 [IU] via TOPICAL

## 2014-02-15 MED ORDER — ALUM & MAG HYDROXIDE-SIMETH 200-200-20 MG/5ML PO SUSP: 30.0000 mL | ORAL | Status: DC | PRN

## 2014-02-15 MED ORDER — ACETAMINOPHEN 650 MG RE SUPP: 650.0000 mg | Freq: Four times a day (QID) | RECTAL | Status: DC | PRN

## 2014-02-15 MED ORDER — HYDROMORPHONE HCL PF 2 MG/ML IJ SOLN
INTRAMUSCULAR | Status: AC
  Filled 2014-02-15: qty 1

## 2014-02-15 MED ORDER — FLEET ENEMA 7-19 GM/118ML RE ENEM: 1.0000 | ENEMA | Freq: Once | RECTAL | Status: AC | PRN

## 2014-02-15 MED ORDER — LIDOCAINE HCL (CARDIAC) 20 MG/ML IV SOLN
INTRAVENOUS | Status: DC | PRN
  Administered 2014-02-15: 50 mg via INTRAVENOUS

## 2014-02-15 MED ORDER — PHENOL 1.4 % MT LIQD: 1.0000 | OROMUCOSAL | Status: DC | PRN

## 2014-02-15 MED ORDER — RIVAROXABAN 10 MG PO TABS
10.0000 mg | ORAL_TABLET | Freq: Every day | ORAL | Status: DC
Start: 2014-02-16 — End: 2014-02-18
  Administered 2014-02-16 – 2014-02-18 (×3): 10 mg via ORAL
  Filled 2014-02-15 (×4): qty 1

## 2014-02-15 MED ORDER — BUPIVACAINE LIPOSOME 1.3 % IJ SUSP
20.0000 mL | Freq: Once | INTRAMUSCULAR | Status: AC
  Administered 2014-02-15: 20 mL
  Filled 2014-02-15: qty 20

## 2014-02-15 MED ORDER — BISACODYL 10 MG RE SUPP: 10.0000 mg | Freq: Every day | RECTAL | Status: DC | PRN

## 2014-02-15 MED ORDER — FENTANYL CITRATE 0.05 MG/ML IJ SOLN
INTRAMUSCULAR | Status: DC | PRN
  Administered 2014-02-15 (×4): 50 ug via INTRAVENOUS
  Administered 2014-02-15 (×2): 100 ug via INTRAVENOUS
  Administered 2014-02-15 (×2): 50 ug via INTRAVENOUS

## 2014-02-15 MED ORDER — OXYCODONE-ACETAMINOPHEN 5-325 MG PO TABS
2.0000 | ORAL_TABLET | ORAL | Status: DC | PRN
  Administered 2014-02-16: 2 via ORAL
  Filled 2014-02-15: qty 2

## 2014-02-15 MED ORDER — HYDROCODONE-ACETAMINOPHEN 5-325 MG PO TABS
1.0000 | ORAL_TABLET | ORAL | Status: DC | PRN
  Administered 2014-02-15: 1 via ORAL
  Filled 2014-02-15: qty 1

## 2014-02-15 MED ORDER — HYDROMORPHONE HCL PF 1 MG/ML IJ SOLN
INTRAMUSCULAR | Status: AC
  Filled 2014-02-15: qty 1

## 2014-02-15 MED ORDER — ROCURONIUM BROMIDE 100 MG/10ML IV SOLN
INTRAVENOUS | Status: AC
  Filled 2014-02-15: qty 1

## 2014-02-15 MED ORDER — SODIUM CHLORIDE 0.9 % IJ SOLN
INTRAMUSCULAR | Status: AC
  Filled 2014-02-15: qty 20

## 2014-02-15 MED ORDER — LACTATED RINGERS IV SOLN
INTRAVENOUS | Status: DC | PRN
  Administered 2014-02-15 (×2): via INTRAVENOUS

## 2014-02-15 MED ORDER — HYDRALAZINE HCL 20 MG/ML IJ SOLN
INTRAMUSCULAR | Status: DC | PRN
  Administered 2014-02-15 (×2): 5 mg via INTRAVENOUS

## 2014-02-15 MED ORDER — LIDOCAINE HCL (CARDIAC) 20 MG/ML IV SOLN
INTRAVENOUS | Status: AC
  Filled 2014-02-15: qty 5

## 2014-02-15 MED ORDER — SODIUM CHLORIDE 0.9 % IJ SOLN
INTRAMUSCULAR | Status: DC | PRN
  Administered 2014-02-15: 20 mL

## 2014-02-15 MED ORDER — ACETAMINOPHEN 325 MG PO TABS
650.0000 mg | ORAL_TABLET | Freq: Four times a day (QID) | ORAL | Status: DC | PRN
Start: 2014-02-15 — End: 2014-02-18
  Administered 2014-02-16: 650 mg via ORAL
  Filled 2014-02-15: qty 2

## 2014-02-15 MED ORDER — LACTATED RINGERS IV SOLN: INTRAVENOUS | Status: DC

## 2014-02-15 MED ORDER — HYDROMORPHONE HCL PF 1 MG/ML IJ SOLN
1.0000 mg | INTRAMUSCULAR | Status: DC | PRN
  Administered 2014-02-15 – 2014-02-16 (×3): 1 mg via INTRAVENOUS
  Filled 2014-02-15 (×3): qty 1

## 2014-02-15 MED ORDER — PROPOFOL 10 MG/ML IV BOLUS
INTRAVENOUS | Status: AC
  Filled 2014-02-15: qty 20

## 2014-02-15 MED ORDER — ONDANSETRON HCL 4 MG/2ML IJ SOLN
INTRAMUSCULAR | Status: DC | PRN
  Administered 2014-02-15: 4 mg via INTRAVENOUS

## 2014-02-15 MED ORDER — MENTHOL 3 MG MT LOZG: 1.0000 | LOZENGE | OROMUCOSAL | Status: DC | PRN

## 2014-02-15 MED ORDER — CEFAZOLIN SODIUM 1-5 GM-% IV SOLN
1.0000 g | Freq: Four times a day (QID) | INTRAVENOUS | Status: AC
  Administered 2014-02-15 (×2): 1 g via INTRAVENOUS
  Filled 2014-02-15 (×2): qty 50

## 2014-02-15 MED ORDER — LABETALOL HCL 5 MG/ML IV SOLN
INTRAVENOUS | Status: AC
  Filled 2014-02-15: qty 4

## 2014-02-15 MED ORDER — METHOCARBAMOL 100 MG/ML IJ SOLN
500.0000 mg | Freq: Four times a day (QID) | INTRAVENOUS | Status: DC | PRN
  Administered 2014-02-15: 500 mg via INTRAVENOUS
  Filled 2014-02-15 (×2): qty 5

## 2014-02-15 MED ORDER — CEFAZOLIN SODIUM-DEXTROSE 2-3 GM-% IV SOLR
2.0000 g | INTRAVENOUS | Status: AC
  Administered 2014-02-15: 2 g via INTRAVENOUS

## 2014-02-15 MED ORDER — LACTATED RINGERS IV SOLN
INTRAVENOUS | Status: DC
  Administered 2014-02-15 – 2014-02-16 (×2): via INTRAVENOUS

## 2014-02-15 MED ORDER — SODIUM CHLORIDE 0.9 % IR SOLN
Status: DC | PRN
  Administered 2014-02-15: 11:00:00

## 2014-02-15 MED ORDER — HYDROMORPHONE HCL PF 1 MG/ML IJ SOLN
0.2500 mg | INTRAMUSCULAR | Status: DC | PRN
  Administered 2014-02-15 (×2): 0.5 mg via INTRAVENOUS

## 2014-02-15 SURGICAL SUPPLY — 64 items
BAG ZIPLOCK 12X15 (MISCELLANEOUS) ×3 IMPLANT
BANDAGE ELASTIC 4 VELCRO ST LF (GAUZE/BANDAGES/DRESSINGS) ×3 IMPLANT
BANDAGE ELASTIC 6 VELCRO ST LF (GAUZE/BANDAGES/DRESSINGS) ×3 IMPLANT
BANDAGE ESMARK 6X9 LF (GAUZE/BANDAGES/DRESSINGS) ×1 IMPLANT
BLADE SAG 18X100X1.27 (BLADE) ×3 IMPLANT
BLADE SAW SGTL 11.0X1.19X90.0M (BLADE) ×3 IMPLANT
BNDG ESMARK 6X9 LF (GAUZE/BANDAGES/DRESSINGS) ×3
BONE CEMENT GENTAMICIN (Cement) ×6 IMPLANT
CAPT RP KNEE ×3 IMPLANT
CEMENT BONE GENTAMICIN 40 (Cement) ×2 IMPLANT
CUFF TOURN SGL QUICK 34 (TOURNIQUET CUFF) ×2
CUFF TRNQT CYL 34X4X40X1 (TOURNIQUET CUFF) ×1 IMPLANT
DERMABOND ADVANCED (GAUZE/BANDAGES/DRESSINGS) ×2
DERMABOND ADVANCED .7 DNX12 (GAUZE/BANDAGES/DRESSINGS) ×1 IMPLANT
DRAPE EXTREMITY T 121X128X90 (DRAPE) ×3 IMPLANT
DRAPE INCISE IOBAN 66X45 STRL (DRAPES) ×3 IMPLANT
DRAPE LG THREE QUARTER DISP (DRAPES) ×3 IMPLANT
DRAPE POUCH INSTRU U-SHP 10X18 (DRAPES) ×3 IMPLANT
DRAPE U-SHAPE 47X51 STRL (DRAPES) ×3 IMPLANT
DRSG AQUACEL AG ADV 3.5X10 (GAUZE/BANDAGES/DRESSINGS) ×3 IMPLANT
DRSG TEGADERM 4X4.75 (GAUZE/BANDAGES/DRESSINGS) ×3 IMPLANT
DURAPREP 26ML APPLICATOR (WOUND CARE) ×3 IMPLANT
ELECT BLADE TIP CTD 4 INCH (ELECTRODE) ×3 IMPLANT
ELECT REM PT RETURN 9FT ADLT (ELECTROSURGICAL) ×3
ELECTRODE REM PT RTRN 9FT ADLT (ELECTROSURGICAL) ×1 IMPLANT
EVACUATOR 1/8 PVC DRAIN (DRAIN) ×3 IMPLANT
FACESHIELD LNG OPTICON STERILE (SAFETY) ×15 IMPLANT
GAUZE SPONGE 2X2 8PLY STRL LF (GAUZE/BANDAGES/DRESSINGS) ×1 IMPLANT
GLOVE BIOGEL PI IND STRL 8 (GLOVE) ×1 IMPLANT
GLOVE BIOGEL PI INDICATOR 8 (GLOVE) ×2
GLOVE ECLIPSE 8.0 STRL XLNG CF (GLOVE) ×6 IMPLANT
GLOVE SURG SS PI 6.5 STRL IVOR (GLOVE) ×6 IMPLANT
GOWN STRL REUS W/TWL LRG LVL3 (GOWN DISPOSABLE) ×3 IMPLANT
GOWN STRL REUS W/TWL XL LVL3 (GOWN DISPOSABLE) ×3 IMPLANT
HANDPIECE INTERPULSE COAX TIP (DISPOSABLE) ×2
IMMOBILIZER KNEE 20 (SOFTGOODS) ×3 IMPLANT
IMMOBILIZER KNEE 22 (SOFTGOODS) ×3 IMPLANT
KIT BASIN OR (CUSTOM PROCEDURE TRAY) ×3 IMPLANT
MANIFOLD NEPTUNE II (INSTRUMENTS) ×3 IMPLANT
NEEDLE HYPO 22GX1.5 SAFETY (NEEDLE) ×3 IMPLANT
NS IRRIG 1000ML POUR BTL (IV SOLUTION) IMPLANT
PACK TOTAL JOINT (CUSTOM PROCEDURE TRAY) ×3 IMPLANT
PAD ABD 8X10 STRL (GAUZE/BANDAGES/DRESSINGS) IMPLANT
PADDING CAST COTTON 6X4 STRL (CAST SUPPLIES) IMPLANT
POSITIONER SURGICAL ARM (MISCELLANEOUS) ×3 IMPLANT
SET HNDPC FAN SPRY TIP SCT (DISPOSABLE) ×1 IMPLANT
SPONGE GAUZE 2X2 STER 10/PKG (GAUZE/BANDAGES/DRESSINGS) ×2
SPONGE LAP 18X18 X RAY DECT (DISPOSABLE) IMPLANT
SPONGE SURGIFOAM ABS GEL 100 (HEMOSTASIS) ×3 IMPLANT
STAPLER VISISTAT 35W (STAPLE) IMPLANT
SUCTION FRAZIER 12FR DISP (SUCTIONS) ×3 IMPLANT
SUT BONE WAX W31G (SUTURE) ×3 IMPLANT
SUT MNCRL AB 4-0 PS2 18 (SUTURE) ×3 IMPLANT
SUT VIC AB 1 CT1 27 (SUTURE) ×4
SUT VIC AB 1 CT1 27XBRD ANTBC (SUTURE) ×2 IMPLANT
SUT VIC AB 2-0 CT1 27 (SUTURE) ×6
SUT VIC AB 2-0 CT1 TAPERPNT 27 (SUTURE) ×3 IMPLANT
SUT VLOC 180 0 24IN GS25 (SUTURE) ×3 IMPLANT
SYR 20CC LL (SYRINGE) ×3 IMPLANT
TOWEL OR 17X26 10 PK STRL BLUE (TOWEL DISPOSABLE) ×6 IMPLANT
TOWER CARTRIDGE SMART MIX (DISPOSABLE) ×3 IMPLANT
TRAY FOLEY CATH 14FRSI W/METER (CATHETERS) ×3 IMPLANT
WATER STERILE IRR 1500ML POUR (IV SOLUTION) ×6 IMPLANT
WRAP KNEE MAXI GEL POST OP (GAUZE/BANDAGES/DRESSINGS) ×3 IMPLANT

## 2014-02-15 NOTE — Brief Op Note (Signed)
02/15/2014  11:51 AM  PATIENT:  [name] Aguilar  63 y.o. 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  SURGEON:  Surgeon(s) and Role:    * Jacki Conesonald A Ezekiah Massie, MD - Primary  PHYSICIAN ASSISTANT: Dimitri PedAmber Constable PA  ASSISTANTS: Dimitri PedAmber Constable PA  ANESTHESIA:   general  EBL:  Total I/O In: 1000 [I.V.:1000] Out: 95 [Urine:95]  BLOOD ADMINISTERED:none  DRAINS: (one) Hemovact drain(s) in the Left Knee with  Suction Open   LOCAL MEDICATIONS USED:  BUPIVICAINE 20cc mixed with 20cc of Normal Saline.   SPECIMEN:  No Specimen  DISPOSITION OF SPECIMEN:  N/A  COUNTS:  YES  TOURNIQUET:  * Missing tourniquet times found for documented tourniquets in log:  [id number] *  DICTATION: .Other Dictation: Dictation Number [id number]  PLAN OF CARE: Admit to inpatient   PATIENT DISPOSITION:  Stable in OR   Delay start of Pharmacological VTE agent (>24hrs) due to surgical blood loss or risk of bleeding: yes

## 2014-02-15 NOTE — Transfer of Care (Signed)
Immediate Anesthesia Transfer of Care Note  Patient: [name] Aguilar  Procedure(s) Performed: Procedure(s): LEFT TOTAL KNEE ARTHROPLASTY (Left)  Patient Location: PACU  Anesthesia Type:General  Level of Consciousness: awake, alert  and patient cooperative  Airway & Oxygen Therapy: Patient Spontanous Breathing and Patient connected to face mask oxygen  Post-op Assessment: Report given to PACU RN, Post -op Vital signs reviewed and stable and Patient moving all extremities  Post vital signs: Reviewed and stable  Complications: No apparent anesthesia complications

## 2014-02-15 NOTE — Interval H&P Note (Signed)
History and Physical Interval Note:  02/15/2014 10:00 AM  [name] Aguilar  has presented today for surgery, with the diagnosis of OSTEOARTHRITIS LEFT KNEE  The various methods of treatment have been discussed with the patient and family. After consideration of risks, benefits and other options for treatment, the patient has consented to  Procedure(s): LEFT TOTAL KNEE ARTHROPLASTY (Left) as a surgical intervention .  The patient's history has been reviewed, patient examined, no change in status, stable for surgery.  I have reviewed the patient's chart and labs.  Questions were answered to the patient's satisfaction.     Fate Galanti A   

## 2014-02-15 NOTE — H&P (Signed)
TOTAL KNEE ADMISSION H&P  Patient is being admitted for left total knee arthroplasty.  Subjective:  Chief Complaint:left knee pain.  HPI: [name] Aguilar, 63 y.o. female, has a history of pain and functional disability in the left knee due to arthritis and has failed non-surgical conservative treatments for greater than 12 weeks to includeNSAID's and/or analgesics, corticosteriod injections, use of assistive devices and activity modification.  Onset of symptoms was gradual, starting >10 years ago with gradually worsening course since that time. The patient noted no past surgery on the left knee(s).  Patient currently rates pain in the left knee(s) at 9 out of 10 with activity. Patient has night pain, worsening of pain with activity and weight bearing, pain that interferes with activities of daily living, pain with passive range of motion, crepitus and joint swelling.  Patient has evidence of periarticular osteophytes, joint subluxation and joint space narrowing by imaging studies. There is no active infection.  Patient Active Problem List   Diagnosis Date Noted  . Rotator cuff (capsule) sprain 11/09/2013  . Complete rotator cuff tear 11/09/2013   Past Medical History  Diagnosis Date  . Hypertension   . GERD (gastroesophageal reflux disease)   . Rotator cuff tear     BILATERAL - SEVERE PAIN BOTH SHOULDERS  . 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 Hypercholesteremia   . Hypothyroidism   . Seizures     YEARS AGO - NONE NOW  . Headache(784.0)     hx of a long time ago, none recent  . Arthritis   . Anemia     hx of    Past Surgical History  Procedure Laterality Date  . Shoulder open rotator cuff repair Right 11/09/2013    Procedure: RIGHT ROTATOR CUFF REPAIR SHOULDER OPEN/CLOSED MANIPULATION/ OPEN ACROMINECTOMY,;  Surgeon: Jacki Cones, MD;  Location: [facility];  Service: Orthopedics;  Laterality: Right;  . Knee arthroscopy Right   . Abdominal hysterectomy       Current  outpatient prescriptions: amLODipine (NORVASC) 5 MG tablet, Take 5 mg by mouth daily with breakfast., Disp: , Rfl: ;   atorvastatin (LIPITOR) 80 MG tablet, Take 80 mg by mouth daily., Disp: , Rfl: ;   ergocalciferol (VITAMIN D2) 50000 UNITS capsule, Take 50,000 Units by mouth every 14 (fourteen) days. , Disp: , Rfl: ;   gabapentin (NEURONTIN) 300 MG capsule, Take 300 mg by mouth 3 (three) times daily., Disp: , Rfl:  ibuprofen (ADVIL,MOTRIN) 800 MG tablet, Take 800 mg by mouth every 8 (eight) hours as needed for mild pain or moderate pain., Disp: , Rfl: ;   levothyroxine (SYNTHROID, LEVOTHROID) 75 MCG tablet, Take 75 mcg by mouth daily before breakfast., Disp: , Rfl: ;   tiZANidine (ZANAFLEX) 4 MG tablet, Take 4 mg by mouth every 6 (six) hours as needed for muscle spasms., Disp: , Rfl:   No Known Allergies  History  Substance Use Topics  . Smoking status: Never Smoker   . Smokeless tobacco: Current User    Types: Snuff  . Alcohol Use: Yes     Comment: 6 BEERS DAILY and "a little liquor daily"      Review of Systems  Constitutional: Positive for malaise/fatigue. Negative for fever, chills, weight loss and diaphoresis.  HENT: Negative.   Eyes: Negative.   Respiratory: Negative.        SOB on exertion  Cardiovascular: Negative.   Gastrointestinal: Negative.   Genitourinary: Negative.   Musculoskeletal: Positive for joint pain  and myalgias. Negative for back pain, falls and neck pain.       Left knee pain  Skin: Negative.   Neurological: Negative.  Negative for weakness.  Endo/Heme/Allergies: Negative.   Psychiatric/Behavioral: Negative.     Objective:  Physical Exam  Constitutional: She is oriented to person, place, and time. She appears well-developed and well-nourished. No distress.  HENT:  Head: Normocephalic and atraumatic.  Right Ear: External ear normal.  Left Ear: External ear normal.  Nose: Nose normal.  Mouth/Throat: Oropharynx is clear and moist.  Eyes:  Conjunctivae and EOM are normal.  Neck: Normal range of motion. Neck supple.  Cardiovascular: Normal rate, regular rhythm, normal heart sounds and intact distal pulses.   No murmur heard. Respiratory: Effort normal and breath sounds normal. No respiratory distress. She has no wheezes.  GI: Soft. Bowel sounds are normal. She exhibits no distension. There is no tenderness.  Musculoskeletal:       Right hip: Normal.       Left hip: Normal.       Right knee: She exhibits decreased range of motion. She exhibits no swelling, no effusion and no erythema. Tenderness found. Medial joint line tenderness noted. No lateral joint line tenderness noted.       Left knee: She exhibits decreased range of motion and swelling. She exhibits no effusion and no erythema. Tenderness found. Medial joint line and lateral joint line tenderness noted.       Right lower leg: She exhibits no tenderness and no swelling.       Left lower leg: She exhibits no tenderness and no swelling.  Left knee has significant varus deformity. ROM 8-120 with severe pain with passive and active motion.   Neurological: She is alert and oriented to person, place, and time. She has normal strength and normal reflexes. No sensory deficit.  Skin: No rash noted. She is not diaphoretic. No erythema.  Psychiatric: She has a normal mood and affect. Her behavior is normal.    Vital signs in last 24 hours: Temp:  [98 F (36.7 C)] 98 F (36.7 C) (03/10 1231) Pulse Rate:  [84] 84 (03/10 1231) Resp:  [16] 16 (03/10 1231) BP: (158)/(82) 158/82 mmHg (03/10 1231) SpO2:  [100 %] 100 % (03/10 1231) Weight:  [74.39 kg (164 lb)] 74.39 kg (164 lb) (03/10 1231)  Labs:   Estimated body mass index is 28.88 kg/(m^2) as calculated from the following:   Height as of 11/09/13: 5\' 3"  (1.6 m).   Weight as of 11/01/13: 73.936 kg (163 lb).   Imaging Review Plain radiographs demonstrate severe degenerative joint disease of the left knee(s). The overall  alignment issignificant varus. The bone quality appears to be adequate for age and reported activity level.  Assessment/Plan:  End stage arthritis, left knee   The patient history, physical examination, clinical judgment of the provider and imaging studies are consistent with end stage degenerative joint disease of the left knee(s) and total knee arthroplasty is deemed medically necessary. The treatment options including medical management, injection therapy arthroscopy and arthroplasty were discussed at length. The risks and benefits of total knee arthroplasty were presented and reviewed. The risks due to aseptic loosening, infection, stiffness, patella tracking problems, thromboembolic complications and other imponderables were discussed. The patient acknowledged the explanation, agreed to proceed with the plan and consent was signed. Patient is being admitted for inpatient treatment for surgery, pain control, PT, OT, prophylactic antibiotics, VTE prophylaxis, progressive ambulation and ADL's and discharge planning. The patient  is planning to be discharged home with home health services.        Dimitri PedAmber Tyneisha Hegeman, PA-C

## 2014-02-15 NOTE — Anesthesia Preprocedure Evaluation (Signed)
Anesthesia Evaluation    Airway Mallampati: II TM Distance: >3 FB Neck ROM: Full    Dental  (+) Edentulous Upper, Edentulous Lower   Pulmonary  breath sounds clear to auscultation  Pulmonary exam normal       Cardiovascular hypertension, Pt. on medications Rhythm:Regular Rate:Normal     Neuro/Psych  Headaches, Seizures -, Well Controlled,     GI/Hepatic GERD-  ,(+) Hepatitis -  Endo/Other  Hypothyroidism   Renal/GU      Musculoskeletal   Abdominal   Peds  Hematology  (+) anemia ,   Anesthesia Other Findings Patient states she inserted "dip" at 0600 this morning and then removed dip at 06:05. Otherwise, NPO.   Reproductive/Obstetrics                           Anesthesia Physical Anesthesia Plan  ASA: II  Anesthesia Plan: General   Post-op Pain Management:    Induction: Intravenous, Rapid sequence and Cricoid pressure planned  Airway Management Planned: Oral ETT  Additional Equipment:   Intra-op Plan:   Post-operative Plan: Extubation in OR  Informed Consent: I have reviewed the patients History and Physical, chart, labs and discussed the procedure including the risks, benefits and alternatives for the proposed anesthesia with the patient or authorized representative who has indicated his/her understanding and acceptance.   Dental advisory given  Plan Discussed with: CRNA  Anesthesia Plan Comments:         Anesthesia Quick Evaluation

## 2014-02-15 NOTE — Interval H&P Note (Signed)
History and Physical Interval Note:  02/15/2014 10:00 AM  [name] Aguilar  has presented today for surgery, with the diagnosis of OSTEOARTHRITIS LEFT KNEE  The various methods of treatment have been discussed with the patient and family. After consideration of risks, benefits and other options for treatment, the patient has consented to  Procedure(s): LEFT TOTAL KNEE ARTHROPLASTY (Left) as a surgical intervention .  The patient's history has been reviewed, patient examined, no change in status, stable for surgery.  I have reviewed the patient's chart and labs.  Questions were answered to the patient's satisfaction.     Lorece Keach A

## 2014-02-15 NOTE — Anesthesia Postprocedure Evaluation (Signed)
Anesthesia Post Note  Patient: [name] [name]  Procedure(s) Performed: Procedure(s) (LRB): LEFT TOTAL KNEE ARTHROPLASTY (Left)  Anesthesia type: General  Patient location: PACU  Post pain: Pain level controlled  Post assessment: Post-op Vital signs reviewed  Last Vitals:  Filed Vitals:   02/15/14 1245  BP: 108/72  Pulse: 96  Temp:   Resp: 13    Post vital signs: Reviewed  Level of consciousness: sedated  Complications: No apparent anesthesia complications

## 2014-02-16 DIAGNOSIS — D62 Acute posthemorrhagic anemia: Secondary | ICD-10-CM | POA: Diagnosis not present

## 2014-02-16 LAB — CBC
HEMATOCRIT: 33.4 % — AB (ref 36.0–46.0)
Hemoglobin: 11.2 g/dL — ABNORMAL LOW (ref 12.0–15.0)
MCH: 32.6 pg (ref 26.0–34.0)
MCHC: 33.5 g/dL (ref 30.0–36.0)
MCV: 97.1 fL (ref 78.0–100.0)
PLATELETS: 201 10*3/uL (ref 150–400)
RBC: 3.44 MIL/uL — ABNORMAL LOW (ref 3.87–5.11)
RDW: 14.4 % (ref 11.5–15.5)
WBC: 11.5 10*3/uL — ABNORMAL HIGH (ref 4.0–10.5)

## 2014-02-16 LAB — BASIC METABOLIC PANEL
BUN: 9 mg/dL (ref 6–23)
CALCIUM: 8.3 mg/dL — AB (ref 8.4–10.5)
CO2: 25 mEq/L (ref 19–32)
Chloride: 95 mEq/L — ABNORMAL LOW (ref 96–112)
Creatinine, Ser: 1.03 mg/dL (ref 0.50–1.10)
GFR, EST AFRICAN AMERICAN: 66 mL/min — AB (ref >90)
GFR, EST NON AFRICAN AMERICAN: 57 mL/min — AB (ref >90)
Glucose, Bld: 139 mg/dL — ABNORMAL HIGH (ref 70–99)
POTASSIUM: 4.2 meq/L (ref 3.7–5.3)
SODIUM: 135 meq/L — AB (ref 137–147)

## 2014-02-16 MED ORDER — DOCUSATE SODIUM 100 MG PO CAPS
100.0000 mg | ORAL_CAPSULE | Freq: Two times a day (BID) | ORAL | Status: DC
  Administered 2014-02-16 – 2014-02-17 (×3): 100 mg via ORAL

## 2014-02-16 MED ORDER — SODIUM CHLORIDE 0.9 % IV BOLUS (SEPSIS)
500.0000 mL | Freq: Once | INTRAVENOUS | Status: AC
  Administered 2014-02-16: 500 mL via INTRAVENOUS

## 2014-02-16 MED ORDER — OXYCODONE HCL 5 MG PO TABS
5.0000 mg | ORAL_TABLET | ORAL | Status: DC | PRN
  Administered 2014-02-16 – 2014-02-17 (×4): 10 mg via ORAL
  Administered 2014-02-17: 5 mg via ORAL
  Administered 2014-02-17: 10 mg via ORAL
  Filled 2014-02-16 (×3): qty 2
  Filled 2014-02-16: qty 1
  Filled 2014-02-16 (×2): qty 2

## 2014-02-16 NOTE — Discharge Instructions (Addendum)
Walk with your walker. Weight bearing as instructed. Home Health Agency will follow you at home for your therapy  Do no change your dressing unless there is excess drainage Shower only, no tub bath. Call if any temperatures greater than 101 or any wound complications: [phone] during the day and ask for Dr. Jeannetta EllisGioffre's nurse, Mackey Birchwoodammy Johnson.   Information on my medicine - XARELTO (Rivaroxaban)  This medication education was reviewed with me or my healthcare representative as part of my discharge preparation.  The pharmacist that spoke with me during my hospital stay was:  Absher, Ky Barbanandall K, RPH  Why was Xarelto prescribed for you? Xarelto was prescribed for you to reduce the risk of blood clots forming after orthopedic surgery. The medical term for these abnormal blood clots is venous thromboembolism (VTE).  What do you need to know about xarelto ? Take your Xarelto ONCE DAILY at the same time every day. You may take it either with or without food.  If you have difficulty swallowing the tablet whole, you may crush it and mix in applesauce just prior to taking your dose.  Take Xarelto exactly as prescribed by your doctor and DO NOT stop taking Xarelto without talking to the doctor who prescribed the medication.  Stopping without other VTE prevention medication to take the place of Xarelto may increase your risk of developing a clot.  After discharge, you should have regular check-up appointments with your healthcare provider that is prescribing your Xarelto.    What do you do if you miss a dose? If you miss a dose, take it as soon as you remember on the same day then continue your regularly scheduled once daily regimen the next day. Do not take two doses of Xarelto on the same day.   Important Safety Information A possible side effect of Xarelto is bleeding. You should call your healthcare provider right away if you experience any of the following:   Bleeding from an injury or  your nose that does not stop.   Unusual colored urine (red or dark brown) or unusual colored stools (red or black).   Unusual bruising for unknown reasons.   A serious fall or if you hit your head (even if there is no bleeding).  Some medicines may interact with Xarelto and might increase your risk of bleeding while on Xarelto. To help avoid this, consult your healthcare provider or pharmacist prior to using any new prescription or non-prescription medications, including herbals, vitamins, non-steroidal anti-inflammatory drugs (NSAIDs) and supplements.  This website has more information on Xarelto: [email or web address].xarelto.com.

## 2014-02-16 NOTE — Progress Notes (Signed)
UR completed. Gerhart Ruggieri RN CCM Case Mgmt phone [phone] 

## 2014-02-16 NOTE — Progress Notes (Signed)
   Subjective: 1 Day Post-Op Procedure(s) (LRB): LEFT TOTAL KNEE ARTHROPLASTY (Left) Patient reports pain as moderate.   Patient seen in rounds without Dr. Darrelyn HillockGioffre. Patient is well, and has had no acute complaints or problems other than pain in the left knee. She denies SOB and chest pain. No issues overnight other than poor urine output.  We will start therapy today.  Plan is to go Home after hospital stay.  Objective: Vital signs in last 24 hours: Temp:  [97.3 F (36.3 C)-99.7 F (37.6 C)] 99.7 F (37.6 C) (03/12 0435) Pulse Rate:  [92-125] 111 (03/12 0435) Resp:  [8-24] 22 (03/12 0435) BP: (97-154)/(62-87) 105/76 mmHg (03/12 0435) SpO2:  [98 %-100 %] 100 % (03/12 0435) Weight:  [68.04 kg (150 lb)] 68.04 kg (150 lb) (03/11 2045)  Intake/Output from previous day:  Intake/Output Summary (Last 24 hours) at 02/16/14 0741 Last data filed at 02/16/14 0543  Gross per 24 hour  Intake   4385 ml  Output    655 ml  Net   3730 ml    Intake/Output this shift:    Labs:  Recent Labs  02/14/14 1235 02/16/14 0545  HGB 14.5 11.2*    Recent Labs  02/14/14 1235 02/16/14 0545  WBC 12.5* 11.5*  RBC 4.43 3.44*  HCT 41.7 33.4*  PLT 268 201    Recent Labs  02/14/14 1235 02/16/14 0432  NA 139 135*  K 3.4* 4.2  CL 96 95*  CO2 30 25  BUN 7 9  CREATININE 0.64 1.03  GLUCOSE 116* 139*  CALCIUM 9.8 8.3*    Recent Labs  02/14/14 1235  INR 0.95    EXAM General - Patient is Alert and Oriented Extremity - Neurologically intact Intact pulses distally Dorsiflexion/Plantar flexion intact Compartment soft Dressing - dressing C/D/I Motor Function - intact, moving foot and toes well on exam.  Hemovac pulled without difficulty.  Past Medical History  Diagnosis Date  . Hypertension   . GERD (gastroesophageal reflux disease)   . Rotator cuff tear     BILATERAL - SEVERE PAIN BOTH SHOULDERS  . 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 Hypercholesteremia   .  Hypothyroidism   . Seizures     YEARS AGO - NONE NOW  . Headache(784.0)     hx of a long time ago, none recent  . Arthritis   . Anemia     hx of    Assessment/Plan: 1 Day Post-Op Procedure(s) (LRB): LEFT TOTAL KNEE ARTHROPLASTY (Left) Active Problems:   Osteoarthritis of left knee   Total knee replacement status  Estimated body mass index is 25.73 kg/(m^2) as calculated from the following:   Height as of this encounter: 5\' 4"  (1.626 m).   Weight as of this encounter: 68.04 kg (150 lb). Advance diet Up with therapy Continue foley due to urinary output monitoring  DVT Prophylaxis - Xarelto Weight-Bearing as tolerated to left leg D/C O2 and Pulse OX and try on Room Air   Will start therapy today. Will continue foley until this afternoon to monitor urine output. Encourage fluids. Recheck labs in the morning.   Chibuike Fleek LAUREN 02/16/2014, 7:41 AM

## 2014-02-16 NOTE — Op Note (Signed)
NAMETESNEEM, DUFRANE NO.:  [id number]  MEDICAL RECORD NO.:  [id number]  LOCATION:  1603                         FACILITY:  [facility]  PHYSICIAN:  Georges Lynch. Masai Kidd, M.D.DATE OF BIRTH:  November 15, 1951  DATE OF PROCEDURE:  02/15/2014 DATE OF DISCHARGE:                              OPERATIVE REPORT   SURGEON:  Georges Lynch. Darrelyn Hillock, M.D.  ASSISTANT:  Dimitri Ped, [place]  PREOPERATIVE DIAGNOSIS: 1. Severe degenerative arthritis, left knee with bone on bone and     subluxation of the tibia laterally. 2. Severe flexion contracture of left knee.  POSTOPERATIVE DIAGNOSIS: 1. Severe degenerative arthritis, left knee with bone on bone and     subluxation of the tibia laterally. 2. Severe flexion contracture of left knee.  OPERATION: 1. Open release of flexion contracture left knee. 2. Left total knee arthroplasty utilizing DePuy system.  All 3 components were cemented.  I utilized a 32 mm patella with 3 drill holes.  The femoral component was a size 3 left posterior cruciate, sacrificing tibial component tray was a size 2.5, the insert was a size 3, 15 mm thickness.  PROCEDURE:  Under general anesthesia, routine orthopedic prep and draping of left lower extremity was carried out.  The appropriate time- out was first carried out.  I also marked the appropriate left leg in the holding area.  At this time, the leg was exsanguinated and Esmarch tourniquet was elevated at 325 mmHg.  The knee then was flexed and an anterior approach of the knee was carried out.  Two flaps were created. I then went down, did a median parapatellar incision and reflected the patella laterally.  I then removed the spurs with the rongeur and also did mediolateral meniscectomy and excised the posterior cruciate ligament.  The anterior cruciate ligament was absent .  Note, she had severe bone on bone with a flexion contracture and the tibia was subluxed laterally.  Great care taken to do the proper  releases at this time.  Then attention was directed to the intercondylar notch, and I made a drill hole in the intercondylar notch after the femoral canal and then inserted the canal finder.  Following that I thoroughly irrigated out the canal.  I then made my appropriate cuts.  I removed 14 mm thickness off the distal femur because of the contracture.  At this time, I measured the femur to be a size 3.  We did anterior, posterior, and chamfering cuts for a size 3 left femur.  Next, attention was directed to the tibial plateau.  She had severe overgrowth of bone spurs, we removed those and we then measured the plateau to be about a size 2.5.  We then made our initial drill hole in the tibial plateau.  I then removed 6 mm thickness off the affected medial side.  We had to go back and still do some posterior releases.  After that, we inserted our lamina spreader and removed the posterior spurs.  We then inserted our flexion and extension gap devices and felt that the 15 mm would fit quite nicely and stable. We then removed those and then proceeded to go on with the tibial cut. We cut  archial cut out the tibia in the usual fashion.  We did our notch cut out distal femur in usual fashion. Thoroughly irrigated out the knee, inserted trial components in.  At this time, we then paid our attention to the  patella. We did a resurfacing procedure on the patella for a 32 mm patella, 3 drill holes were made in the patella.  We thoroughly irrigated out the area, removed all trial components.  We dried the knee out.  I injected a portion of the mixture of 20 mL of Exparel with 20 mL of normal saline into the medial and posterior compartments.  Great care was taken not to inject this into any other vessel.  I then cemented all 3 components and simultaneously gentamicin was used in the cement.  Once the cement was hardened, we removed all loose pieces of cement, water picked out the knee, went through  trials again and finally selected a 15-mm thickness size 3 rotating platform that was inserted. The knee was reduced we had excellent function.  I also inserted some thrombin-soaked Gelfoam posteriorly.  Following that, we did check our stability again. The patella looked fine.  Everything lined up nicely.  We inserted a Hemovac drain and closed the knee in layers in the usual fashion.  Note, she had 2 g of IV Ancef preop.          ______________________________ Georges Lynchonald A. Darrelyn HillockGioffre, M.D.     RAG/MEDQ  D:  02/15/2014  T:  02/16/2014  Job:  [id number]

## 2014-02-16 NOTE — Progress Notes (Signed)
Physical Therapy Evaluation Patient Details Name: [name] Aguilar MRN: [id number] DOB: 09/27/1951 Today's Date: 02/16/2014 Time: [id number] PT Time Calculation (min): 40 min  PT Assessment / Plan / Recommendation History of Present Illness     Clinical Impression  Pt s/p L TKR presents with decreased L LE strength/ROM and post op pain limiting functional mobility.  Pt should progress to d/c home with assist of family/friends and follow up HHPT    PT Assessment  Patient needs continued PT services    Follow Up Recommendations  Home health PT    Does the patient have the potential to tolerate intense rehabilitation      Barriers to Discharge        Equipment Recommendations  Rolling walker with 5" wheels    Recommendations for Other Services OT consult   Frequency 7X/week    Precautions / Restrictions Precautions Precautions: Knee;Fall Required Braces or Orthoses: Knee Immobilizer - Right Knee Immobilizer - Right: Discontinue once straight leg raise with < 10 degree lag Restrictions Weight Bearing Restrictions: No Other Position/Activity Restrictions: WBAT   Pertinent Vitals/Pain 6/10; premed, RN aware      Mobility  Bed Mobility Overal bed mobility: Needs Assistance Bed Mobility: Supine to Sit Supine to sit: Mod assist General bed mobility comments: cues for sequence and use of R LE to self assist; physical assist with L LE and to bring trunk to upright Transfers Overall transfer level: Needs assistance Equipment used: Rolling walker (2 wheeled) Transfers: Sit to/from Stand Sit to Stand: Mod assist General transfer comment: cues for LE management and use of UEs to self assist Ambulation/Gait Ambulation/Gait assistance: Min assist;Mod assist Ambulation Distance (Feet): 26 Feet Assistive device: Rolling walker (2 wheeled) Gait Pattern/deviations: Step-to pattern;Decreased step length - right;Decreased step length - left;Shuffle;Trunk flexed Gait velocity:  decr General Gait Details: cues for posture, stride length, position from RW and sequence    Exercises Total Joint Exercises Ankle Circles/Pumps: AROM;15 reps;Supine;Both Quad Sets: AROM;Both;10 reps;Supine Heel Slides: AAROM;10 reps;Supine;Left Straight Leg Raises: AAROM;10 reps;Left;Supine   PT Diagnosis: Difficulty walking  PT Problem List: Decreased strength;Decreased range of motion;Decreased activity tolerance;Decreased mobility;Decreased knowledge of use of DME;Pain PT Treatment Interventions: DME instruction;Gait training;Stair training;Functional mobility training;Therapeutic activities;Therapeutic exercise;Patient/family education     PT Goals(Current goals can be found in the care plan section) Acute Rehab PT Goals Patient Stated Goal: Resume previous lifestyle with decreased pain PT Goal Formulation: With patient Time For Goal Achievement: 02/23/14 Potential to Achieve Goals: Good  Visit Information  Last PT Received On: 02/16/14 Assistance Needed: +1       Prior Functioning  Home Living Family/patient expects to be discharged to:: Private residence Living Arrangements: Spouse/significant other Available Help at Discharge: Family;Friend(s);Available 24 hours/day Type of Home: House Home Access: Stairs to enter Enter[employer] CorporationEntrance Stairs-Number of Steps: 3 Entrance Stairs-Rails: Right Home Layout: One level Home Equipment: Cane - single point Prior Function Level of Independence: Independent with assistive device(s) Communication Communication: No difficulties Dominant Hand: Right    Cognition  Cognition Arousal/Alertness: Awake/alert Behavior During Therapy: WFL for tasks assessed/performed Overall Cognitive Status: Within Functional Limits for tasks assessed    Extremity/Trunk Assessment Upper Extremity Assessment Upper Extremity Assessment: RUE deficits/detail;LUE deficits/detail;Defer to OT evaluation Lower Extremity Assessment Lower Extremity Assessment: LLE  deficits/detail LLE Deficits / Details: 2/5 quads with AAROM at knee -10 - 25   Balance    End of Session PT - End of Session Equipment Utilized During Treatment: Gait belt;Left knee immobilizer Activity Tolerance: Patient tolerated treatment  well Patient left: in chair;with call bell/phone within reach Nurse Communication: Mobility status  GP     [name] Aguilar 02/16/2014, 1:11 PM

## 2014-02-16 NOTE — Progress Notes (Signed)
Patient's urine output less than 360 ml for the the night.  Irish EldersJackie Bissell PA paged, new orders received.  Will continue to monitor.

## 2014-02-16 NOTE — Progress Notes (Signed)
[facility] the [facility]: RW and Commode  If patient discharges after hours, please call [phone].   [name] HamperLecretia Williamson 02/16/2014, 12:15 PM

## 2014-02-16 NOTE — Progress Notes (Signed)
   CARE MANAGEMENT NOTE 02/16/2014  Patient:  [name] Aguilar,[name] Aguilar   Account Number:  [id number]  Date Initiated:  02/16/2014  Documentation initiated by:  [facility],Keviana Guida  Subjective/Objective Assessment:   LEFT TOTAL KNEE ARTHROPLASTY     Action/Plan:   HH   Anticipated DC Date:  02/17/2014   Anticipated DC Plan:  HOME W HOME HEALTH SERVICES      DC Planning Services  CM consult      [facility]   Choice offered to / List presented to:  C-1 Patient   DME arranged  3-N-1  Levan HurstWALKER - ROLLING      DME agency  [facility]        [facility]  [facility]   Status of service:  Completed, signed off Medicare Important Message given?   (If response is "NO", the following Medicare IM given date fields will be blank) Date Medicare IM given:   Date Additional Medicare IM given:    Discharge Disposition:  HOME W HOME HEALTH SERVICES  Per UR Regulation:    If discussed at Long Length of Stay Meetings, dates discussed:    Comments:  02/16/2014 1200 NCM spoke to pt and [facility] Pt requesting RW and 3n1 for home. She has a shower chair at home. [facility] notified of DME needed for scheduled dc tomorrow home. She has boyfriend and grand-daughter that will assist her at  home. Isidoro DonningAlesia Breyanna Valera RN CCM Case Mgmt phone [phone]

## 2014-02-16 NOTE — Evaluation (Signed)
Occupational Therapy Evaluation Patient Details Name: [name] Aguilar MRN: [id number] DOB: 07/21/1951 Today's Date: 02/16/2014 Time: [id number] OT Time Calculation (min): 33 min  OT Assessment / Plan / Recommendation History of present illness Pt currently admitted for  L TKA and had L RCR in Dec 2014.   Clinical Impression   Pt limited by pain this session but was due for pain meds. Nursing in to give meds. WIll benefit from skilled OT services to increase independence for d/c home with family.    OT Assessment  Patient needs continued OT Services    Follow Up Recommendations  Home health OT;Supervision/Assistance - 24 hour    Barriers to Discharge      Equipment Recommendations  3 in 1 bedside comode (delivered)    Recommendations for Other Services    Frequency  Min 2X/week    Precautions / Restrictions Precautions Precautions: Knee;Fall Required Braces or Orthoses: Knee Immobilizer - Left Knee Immobilizer - Right: Discontinue once straight leg raise with < 10 degree lag Restrictions Weight Bearing Restrictions: No Other Position/Activity Restrictions: WBAT   Pertinent Vitals/Pain 5/10 L knee; reposition, ice, notified nursing    ADL  Eating/Feeding: Simulated;Independent Where Assessed - Eating/Feeding: Chair Grooming: Simulated;Wash/dry hands;Set up Where Assessed - Grooming: Supported sitting Upper Body Bathing: Simulated;Chest;Right arm;Left arm;Abdomen;Supervision/safety;Set up Where Assessed - Upper Body Bathing: Unsupported sitting Lower Body Bathing: Simulated;Moderate assistance;Maximal assistance Where Assessed - Lower Body Bathing: Supported sit to stand Upper Body Dressing: Simulated;Minimal assistance Where Assessed - Upper Body Dressing: Unsupported sitting Lower Body Dressing: Simulated;Maximal assistance Where Assessed - Lower Body Dressing: Supported sit to stand Toilet Transfer: Performed;Minimal assistance;Moderate assistance Toilet Transfer  Method: Stand pivot Toilet Transfer Equipment: Bedside commode Toileting - Clothing Manipulation and Hygiene: Simulated;Moderate assistance Where Assessed - Toileting Clothing Manipulation and Hygiene: Sit to stand from 3-in-1 or toilet Equipment Used: Knee Immobilizer;Rolling walker ADL Comments: Introduced concept of AE but pt states family will assist with LB dressing. Pt with increased pain with minimal lifting of L LE off the recliner legrest. Pt states she feels "very stiff" right now. [facility] and then to bed. Note bleeding onto abd pad and towel inside of KI. Notified nursing. Pt states she was not receiving any therapy for R shoulder and was doing her own exercises since her surgery in Dec.     OT Diagnosis: Generalized weakness  OT Problem List: Decreased strength;Decreased knowledge of use of DME or AE OT Treatment Interventions: Self-care/ADL training;DME and/or AE instruction;Therapeutic activities;Patient/family education   OT Goals(Current goals can be found in the care plan section) Acute Rehab OT Goals Patient Stated Goal: Resume previous lifestyle with decreased pain OT Goal Formulation: With patient Time For Goal Achievement: 02/23/14 Potential to Achieve Goals: Good  Visit Information  Last OT Received On: 02/16/14 Assistance Needed: +1 History of Present Illness: Pt currently admitted for  L TKA and had L RCR in Dec 2014.       Prior Functioning     Home Living Family/patient expects to be discharged to:: Private residence Living Arrangements: Spouse/significant other Available Help at Discharge: Family;Friend(s);Available 24 hours/day Type of Home: House Home Access: Stairs to enter [employer] CorporationEntrance Stairs-Number of Steps: 3 Entrance Stairs-Rails: Right Home Layout: One level Home Equipment: Cane - single point Prior Function Level of Independence: Independent with assistive device(s) Communication Communication: No difficulties Dominant Hand: Right          Vision/Perception     Cognition  Cognition Arousal/Alertness: Awake/alert Behavior During Therapy: WFL for tasks  assessed/performed Overall Cognitive Status: Within Functional Limits for tasks assessed    Extremity/Trunk Assessment Upper Extremity Assessment Upper Extremity Assessment: RUE deficits/detail RUE Deficits / Details: only approximately 60 degrees AROM R shoulder then uses L UE to help raise to about 90 degrees.  Lower Extremity Assessment Lower Extremity Assessment: LLE deficits/detail LLE Deficits / Details: 2/5 quads with AAROM at knee -10 - 25     Mobility Bed Mobility Overal bed mobility: Needs Assistance Bed Mobility: Sit to Supine Supine to sit: Mod assist Sit to supine: Min assist General bed mobility comments: assist for L LE onto bed. cues for technique. Transfers Overall transfer level: Needs assistance Equipment used: Rolling walker (2 wheeled) Transfers: Sit to/from Stand Sit to Stand: Min assist;Mod assist General transfer comment: cues for LE management and use of UEs to self assist        Balance     End of Session OT - End of Session Equipment Utilized During Treatment: Gait belt;Rolling walker Activity Tolerance: Patient limited by pain Patient left: in bed;with call bell/phone within reach  GO     [name] Aguilar [phone] 02/16/2014, 1:34 PM

## 2014-02-16 NOTE — Progress Notes (Signed)
CSW consulted for SNF placement. PN reviewed. Pt plans to return home following hospital d/c. RNCM will assist with d/c planning. CSW is available to assist with SNF placement if recommended by therapy.  Cori RazorJamie Kenji Mapel LCSW [phone]

## 2014-02-17 LAB — CBC
HCT: 28.2 % — ABNORMAL LOW (ref 36.0–46.0)
Hemoglobin: 9.4 g/dL — ABNORMAL LOW (ref 12.0–15.0)
MCH: 32.2 pg (ref 26.0–34.0)
MCHC: 33.3 g/dL (ref 30.0–36.0)
MCV: 96.6 fL (ref 78.0–100.0)
Platelets: 160 10*3/uL (ref 150–400)
RBC: 2.92 MIL/uL — AB (ref 3.87–5.11)
RDW: 14.1 % (ref 11.5–15.5)
WBC: 11.7 10*3/uL — ABNORMAL HIGH (ref 4.0–10.5)

## 2014-02-17 LAB — BASIC METABOLIC PANEL
BUN: 8 mg/dL (ref 6–23)
CALCIUM: 8.6 mg/dL (ref 8.4–10.5)
CO2: 28 mEq/L (ref 19–32)
Chloride: 96 mEq/L (ref 96–112)
Creatinine, Ser: 0.81 mg/dL (ref 0.50–1.10)
GFR calc Af Amer: 88 mL/min — ABNORMAL LOW (ref >90)
GFR, EST NON AFRICAN AMERICAN: 76 mL/min — AB (ref >90)
Glucose, Bld: 139 mg/dL — ABNORMAL HIGH (ref 70–99)
POTASSIUM: 3.2 meq/L — AB (ref 3.7–5.3)
SODIUM: 136 meq/L — AB (ref 137–147)

## 2014-02-17 MED ORDER — DOCUSATE SODIUM 50 MG/5ML PO LIQD
100.0000 mg | Freq: Two times a day (BID) | ORAL | Status: DC
  Administered 2014-02-17 – 2014-02-18 (×3): 100 mg via ORAL
  Filled 2014-02-17 (×4): qty 10

## 2014-02-17 MED ORDER — FERROUS SULFATE 325 (65 FE) MG PO TABS: 325.0000 mg | ORAL_TABLET | Freq: Three times a day (TID) | ORAL | Status: DC

## 2014-02-17 MED ORDER — OXYCODONE HCL 5 MG PO TABS
5.0000 mg | ORAL_TABLET | ORAL | Status: DC | PRN
  Administered 2014-02-17 – 2014-02-18 (×4): 15 mg via ORAL
  Filled 2014-02-17 (×4): qty 3

## 2014-02-17 MED ORDER — METOPROLOL TARTRATE 50 MG PO TABS
50.0000 mg | ORAL_TABLET | Freq: Once | ORAL | Status: AC
  Administered 2014-02-17: 50 mg via ORAL
  Filled 2014-02-17: qty 1

## 2014-02-17 MED ORDER — OXYCODONE HCL 5 MG PO TABS: 5.0000 mg | ORAL_TABLET | ORAL | Status: DC | PRN

## 2014-02-17 MED ORDER — DSS 100 MG PO CAPS: 100.0000 mg | ORAL_CAPSULE | Freq: Two times a day (BID) | ORAL | Status: DC

## 2014-02-17 MED ORDER — RIVAROXABAN 10 MG PO TABS: 10.0000 mg | ORAL_TABLET | Freq: Every day | ORAL | Status: DC

## 2014-02-17 NOTE — Progress Notes (Signed)
   CARE MANAGEMENT NOTE 02/17/2014  Patient:  [name] Aguilar   Account Number:  [id number]  Date Initiated:  02/16/2014  Documentation initiated by:  [facility]ALES[facility]  Subjective/Objective Assessment:   LEFT TOTAL KNEE ARTHROPLASTY     Action/Plan:   HH   Anticipated DC Date:  02/17/2014   Anticipated DC Plan:  HOME W HOME HEALTH SERVICES      DC Planning Services  CM consult      [facility]   Choice offered to / List presented to:  C-1 Patient   DME arranged  3-N-1  Levan HurstWALKER - ROLLING      DME agency  [facility]     HH arranged  HH-2 PT  HH-3 OT      [facility]   Status of service:  Completed, signed off Medicare Important Message given?   (If response is "NO", the following Medicare IM given date fields will be blank) Date Medicare IM given:   Date Additional Medicare IM given:    Discharge Disposition:  HOME W HOME HEALTH SERVICES  Per UR Regulation:    If discussed at Long Length of Stay Meetings, dates discussed:    Comments:  02/17/14 11:00 CM notified [name] [facility] OT to HHPT.  No other CM needs were communicated. Freddy JakschSarah Raja Caputi, BSN, Caryl AdaM [phone].  02/16/2014 1200 NCM spoke to pt and [facility]. [facility]. Pt requesting RW and 3n1 for home. She has a shower chair at home. [facility] notified of DME needed for scheduled dc tomorrow home. She has boyfriend and grand-daughter that will assist her at  home. Isidoro DonningAlesia Shavis RN CCM Case Mgmt phone [phone]

## 2014-02-17 NOTE — Progress Notes (Signed)
Occupational Therapy Treatment Patient Details Name: [name] Aguilar MRN: [id number] DOB: 05/04/1951 Today's Date: 02/17/2014 Time: [id number] OT Time Calculation (min): 29 min  OT Assessment / Plan / Recommendation  History of present illness Pt currently admitted for  L TKA and had L RCR in Dec 2014.   OT comments  Pt is making good progress with OT but will benefit from reinforcement.  She is not yet ready to perform shower transfer  Follow Up Recommendations  Home health OT;Supervision/Assistance - 24 hour    Barriers to Discharge       Equipment Recommendations  3 in 1 bedside comode    Recommendations for Other Services    Frequency Min 2X/week   Progress towards OT Goals Progress towards OT goals: Progressing toward goals  Plan      Precautions / Restrictions Precautions Precautions: Knee;Fall Restrictions Weight Bearing Restrictions: No   Pertinent Vitals/Pain Moderate, L knee with weight bearing. Repositioned and applied ice    ADL  Toilet Transfer: Min guard Toilet Transfer Method: Sit to Barist[place] Toilet Transfer Equipment: Raised toilet seat with arms (or 3-in-1 over toilet) Equipment Used: Knee Immobilizer;Rolling walker Transfers/Ambulation Related to ADLs: ambulated to bathroom with min guard assist and performed toilet transfer.  Attempted shower transfer but pt is not yet able to perform this.  She states she is fine with washing with a pan of water at first.  Limited by pain and strength    OT Diagnosis:    OT Problem List:   OT Treatment Interventions:     OT Goals(current goals can now be found in the care plan section)    Visit Information  Last OT Received On: 02/17/14 Assistance Needed: +1 History of Present Illness: Pt currently admitted for  L TKA and had L RCR in Dec 2014.    Subjective Data      Prior Functioning       Cognition  Cognition Arousal/Alertness: Awake/alert Behavior During Therapy: WFL for tasks assessed/performed Overall  Cognitive Status: Within Functional Limits for tasks assessed    Mobility  Bed Mobility Bed Mobility: Supine to Sit Supine to sit: Min assist General bed mobility comments: cues for technique and assist for LLE Transfers Transfers: Sit to/from Stand Sit to Stand: Min assist;Min guard (min A from bed; min guard from 3:1)    Exercises      Balance    End of Session OT - End of Session Activity Tolerance: Patient tolerated treatment well Patient left: in chair;with call bell/phone within reach  GO     [facility],[name] Aguilar 02/17/2014, 9:34 AM [name] OtterMaryellen Milferd Aguilar, OTR/L [phone] 02/17/2014

## 2014-02-17 NOTE — Progress Notes (Signed)
Physical Therapy Treatment Patient Details Name: [name] ChinaJanie Carcamo MRN: [id number] DOB: 03/24/1951 Today's Date: 02/17/2014 Time: [id number] PT Time Calculation (min): 23 min  PT Assessment / Plan / Recommendation  History of Present Illness Pt currently admitted for  L TKA and had L RCR in Dec 2014.   PT Comments   Reviewed don/doff KI  Follow Up Recommendations  Home health PT     Does the patient have the potential to tolerate intense rehabilitation     Barriers to Discharge        Equipment Recommendations  Rolling walker with 5" wheels    Recommendations for Other Services OT consult  Frequency 7X/week   Progress towards PT Goals Progress towards PT goals: Progressing toward goals  Plan Current plan remains appropriate    Precautions / Restrictions Precautions Precautions: Knee;Fall Required Braces or Orthoses: Knee Immobilizer - Left Knee Immobilizer - Left: Discontinue once straight leg raise with < 10 degree lag Restrictions Weight Bearing Restrictions: No Other Position/Activity Restrictions: WBAT   Pertinent Vitals/Pain 5/10; ice packs provided    Mobility  Bed Mobility Overal bed mobility: Needs Assistance Bed Mobility: Supine to Sit;Sit to Supine Supine to sit: Min guard Sit to supine: Min guard General bed mobility comments: cues for technique and assist for LLE Transfers Overall transfer level: Needs assistance Equipment used: Rolling walker (2 wheeled) Transfers: Sit to/from Stand Sit to Stand: Min guard General transfer comment: cues for LE management and use of UEs to self assist Ambulation/Gait Ambulation/Gait assistance: Min guard Ambulation Distance (Feet): 74 Feet (twice) Assistive device: Rolling walker (2 wheeled) Gait Pattern/deviations: Step-to pattern;Step-through pattern;Shuffle;Antalgic;Trunk flexed    Exercises     PT Diagnosis:    PT Problem List:   PT Treatment Interventions:     PT Goals (current goals can now be found in the  care plan section) Acute Rehab PT Goals Patient Stated Goal: Resume previous lifestyle with decreased pain PT Goal Formulation: With patient Time For Goal Achievement: 02/23/14 Potential to Achieve Goals: Good  Visit Information  Last PT Received On: 02/17/14 Assistance Needed: +1 History of Present Illness: Pt currently admitted for  L TKA and had L RCR in Dec 2014.    Subjective Data  Subjective: C/o pain Patient Stated Goal: Resume previous lifestyle with decreased pain   Cognition  Cognition Arousal/Alertness: Awake/alert Behavior During Therapy: WFL for tasks assessed/performed Overall Cognitive Status: Within Functional Limits for tasks assessed    Balance     End of Session PT - End of Session Equipment Utilized During Treatment: Gait belt;Left knee immobilizer Activity Tolerance: Patient tolerated treatment well Patient left: in bed;with call bell/phone within reach Nurse Communication: Mobility status   GP     Jaquel Coomer 02/17/2014, 4:43 PM

## 2014-02-17 NOTE — Progress Notes (Signed)
Subjective: 2 Days Post-Op Procedure(s) (LRB): LEFT TOTAL KNEE ARTHROPLASTY (Left) Patient reports pain as 3 on 0-10 scale.Dressing changed at drain site. No major Post-Op problems    Objective: Vital signs in last 24 hours: Temp:  [98.3 F (36.8 C)-98.9 F (37.2 C)] 98.9 F (37.2 C) (03/13 0509) Pulse Rate:  [92-111] 104 (03/13 0509) Resp:  [16-18] 16 (03/13 0509) BP: (118-133)/(77-89) 133/77 mmHg (03/13 0509) SpO2:  [93 %-95 %] 94 % (03/13 0509)  Intake/Output from previous day: 03/12 0701 - 03/13 0700 In: 3020 [P.O.:720; I.V.:2300] Out: 925 [Urine:925] Intake/Output this shift:     Recent Labs  02/14/14 1235 02/16/14 0545 02/17/14 0438  HGB 14.5 11.2* 9.4*    Recent Labs  02/16/14 0545 02/17/14 0438  WBC 11.5* 11.7*  RBC 3.44* 2.92*  HCT 33.4* 28.2*  PLT 201 160    Recent Labs  02/16/14 0432 02/17/14 0438  NA 135* 136*  K 4.2 3.2*  CL 95* 96  CO2 25 28  BUN 9 8  CREATININE 1.03 0.81  GLUCOSE 139* 139*  CALCIUM 8.3* 8.6    Recent Labs  02/14/14 1235  INR 0.95    No cellulitis present Compartment soft  Assessment/Plan: 2 Days Post-Op Procedure(s) (LRB): LEFT TOTAL KNEE ARTHROPLASTY (Left) Discharge home with home health.Also spoke with Urology and they will follow in office. Instructions given to Patient.  Sid Greener A 02/17/2014, 7:31 AM

## 2014-02-17 NOTE — Progress Notes (Signed)
Physical Therapy Treatment Patient Details Name: [name] Aguilar MRN: [id number] DOB: 08/08/1951 Today's Date: 02/17/2014 Time: [id number] PT Time Calculation (min): 19 min  PT Assessment / Plan / Recommendation  History of Present Illness Pt currently admitted for  L TKA and had L RCR in Dec 2014.   PT Comments   Pt declines OOB this am stating she had already walked to/from bathroom with nursing.  Agreeable to therex in bed.  Follow Up Recommendations  Home health PT     Does the patient have the potential to tolerate intense rehabilitation     Barriers to Discharge        Equipment Recommendations  Rolling walker with 5" wheels    Recommendations for Other Services OT consult  Frequency 7X/week   Progress towards PT Goals Progress towards PT goals: Progressing toward goals  Plan Current plan remains appropriate    Precautions / Restrictions Precautions Precautions: Knee;Fall Required Braces or Orthoses: Knee Immobilizer - Left Knee Immobilizer - Right: Discontinue once straight leg raise with < 10 degree lag Knee Immobilizer - Left: Discontinue once straight leg raise with < 10 degree lag Restrictions Weight Bearing Restrictions: No Other Position/Activity Restrictions: WBAT   Pertinent Vitals/Pain 6/10; premed, cold packs provided, RN providing additional pain meds    Mobility  Bed Mobility Overal bed mobility:  (NT - pt declines OOB but willing to attempt ther ex )    Exercises Total Joint Exercises Ankle Circles/Pumps: AROM;15 reps;Supine;Both Quad Sets: AROM;Both;Supine;15 reps Heel Slides: AAROM;Supine;Left;20 reps Straight Leg Raises: AAROM;Left;Supine;15 reps Goniometric ROM: AAROM at knee -10 - 35   PT Diagnosis:    PT Problem List:   PT Treatment Interventions:     PT Goals (current goals can now be found in the care plan section) Acute Rehab PT Goals Patient Stated Goal: Resume previous lifestyle with decreased pain PT Goal Formulation: With  patient Time For Goal Achievement: 02/23/14 Potential to Achieve Goals: Good  Visit Information  Last PT Received On: 02/17/14 Assistance Needed: +1 History of Present Illness: Pt currently admitted for  L TKA and had L RCR in Dec 2014.    Subjective Data  Subjective: C/o pain Patient Stated Goal: Resume previous lifestyle with decreased pain   Cognition  Cognition Arousal/Alertness: Awake/alert Behavior During Therapy: WFL for tasks assessed/performed Overall Cognitive Status: Within Functional Limits for tasks assessed    Balance     End of Session PT - End of Session Activity Tolerance: Patient limited by pain Patient left: in bed;with call bell/phone within reach Nurse Communication: Mobility status   GP     Dvante Hands 02/17/2014, 1:49 PM

## 2014-02-18 LAB — CBC
HCT: 26.1 % — ABNORMAL LOW (ref 36.0–46.0)
Hemoglobin: 8.5 g/dL — ABNORMAL LOW (ref 12.0–15.0)
MCH: 31.8 pg (ref 26.0–34.0)
MCHC: 32.6 g/dL (ref 30.0–36.0)
MCV: 97.8 fL (ref 78.0–100.0)
PLATELETS: 190 10*3/uL (ref 150–400)
RBC: 2.67 MIL/uL — AB (ref 3.87–5.11)
RDW: 14.2 % (ref 11.5–15.5)
WBC: 14 10*3/uL — ABNORMAL HIGH (ref 4.0–10.5)

## 2014-02-18 NOTE — Progress Notes (Signed)
Discharged from floor via w/c, family with pt. No changes in assessment. Audri Kozub  

## 2014-02-18 NOTE — Progress Notes (Signed)
   Subjective: 3 Days Post-Op Procedure(s) (LRB): LEFT TOTAL KNEE ARTHROPLASTY (Left) Patient reports pain as mild.   Patient seen in rounds with Dr. Lequita HaltAluisio. Patient is well, but has had some minor complaints of pain in the knee, requiring pain medications Patient is ready to go home  Objective: Vital signs in last 24 hours: Temp:  [98.5 F (36.9 C)-99 F (37.2 C)] 98.5 F (36.9 C) (03/14 0530) Pulse Rate:  [62-118] 62 (03/14 0530) Resp:  [16] 16 (03/14 0530) BP: (102-143)/(70-90) 102/70 mmHg (03/14 0530) SpO2:  [90 %-100 %] 100 % (03/14 0530)  Intake/Output from previous day:  Intake/Output Summary (Last 24 hours) at 02/18/14 0806 Last data filed at 02/18/14 0600  Gross per 24 hour  Intake    900 ml  Output    975 ml  Net    -75 ml    Intake/Output this shift:    Labs:  Recent Labs  02/16/14 0545 02/17/14 0438 02/18/14 0515  HGB 11.2* 9.4* 8.5*    Recent Labs  02/17/14 0438 02/18/14 0515  WBC 11.7* 14.0*  RBC 2.92* 2.67*  HCT 28.2* 26.1*  PLT 160 190    Recent Labs  02/16/14 0432 02/17/14 0438  NA 135* 136*  K 4.2 3.2*  CL 95* 96  CO2 25 28  BUN 9 8  CREATININE 1.03 0.81  GLUCOSE 139* 139*  CALCIUM 8.3* 8.6   No results found for this basename: LABPT, INR,  in the last 72 hours  EXAM: General - Patient is Alert and Appropriate Extremity - Neurovascular intact Sensation intact distally Dorsiflexion/Plantar flexion intact Incision - Aqacel Dressing in place, only small spots of drainage under dressing Motor Function - intact, moving foot and toes well on exam.   Assessment/Plan: 3 Days Post-Op Procedure(s) (LRB): LEFT TOTAL KNEE ARTHROPLASTY (Left) Procedure(s) (LRB): LEFT TOTAL KNEE ARTHROPLASTY (Left) Past Medical History  Diagnosis Date  . Hypertension   . GERD (gastroesophageal reflux disease)   . Rotator cuff tear     BILATERAL - SEVERE PAIN BOTH SHOULDERS  . 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 Hypercholesteremia    . Hypothyroidism   . Seizures     YEARS AGO - NONE NOW  . Headache(784.0)     hx of a long time ago, none recent  . Arthritis   . Anemia     hx of   Active Problems:   Osteoarthritis of left knee   Total knee replacement status   Acute blood loss anemia  Estimated body mass index is 25.73 kg/(m^2) as calculated from the following:   Height as of this encounter: 5\' 4"  (1.626 m).   Weight as of this encounter: 68.04 kg (150 lb). Up with therapy Discharge home with home health Diet - Cardiac diet Follow up - in 2 weeks Activity - WBAT Disposition - Home Condition Upon Discharge - Good D/C Meds - See DC Summary DVT Prophylaxis - Xarelto  PERKINS, ALEXZANDREW 02/18/2014, 8:06 AM

## 2014-02-18 NOTE — Progress Notes (Signed)
Physical Therapy Treatment Patient Details Name: [name] Aguilar MRN: [id number] DOB: 07/15/1951 Today's Date: 02/18/2014 Time: [id number] PT Time Calculation (min): 38 min  PT Assessment / Plan / Recommendation  History of Present Illness Pt currently admitted for  L TKA and had L RCR in Dec 2014.   PT Comments   Marked improvement in activity tolerance and stability with mobility noted.  Pt planning d/c home this date with family assist and HHPT follow up.  Follow Up Recommendations  Home health PT     Does the patient have the potential to tolerate intense rehabilitation     Barriers to Discharge        Equipment Recommendations  Rolling walker with 5" wheels    Recommendations for Other Services OT consult  Frequency 7X/week   Progress towards PT Goals Progress towards PT goals: Progressing toward goals  Plan Current plan remains appropriate    Precautions / Restrictions Precautions Precautions: Knee;Fall Required Braces or Orthoses: Knee Immobilizer - Left Knee Immobilizer - Right: Discontinue once straight leg raise with < 10 degree lag Knee Immobilizer - Left: Discontinue once straight leg raise with < 10 degree lag Restrictions Weight Bearing Restrictions: No Other Position/Activity Restrictions: WBAT   Pertinent Vitals/Pain 4/10; premed, ice packs provided    Mobility  Bed Mobility Overal bed mobility: Modified Independent Transfers Overall transfer level: Modified independent Equipment used: Rolling walker (2 wheeled) Transfers: Sit to/from [employer] General transfer comment: min cues for use of UEs Ambulation/Gait Ambulation/Gait assistance: Supervision Ambulation Distance (Feet): 200 Feet Assistive device: Rolling walker (2 wheeled) Gait Pattern/deviations: Step-to pattern;Step-through pattern;Trunk flexed General Gait Details: min cues for posture and position from RW Stairs: Yes Stairs assistance: Min assist Stair Management: One rail Left;Step to  pattern;Forwards;With cane Number of Stairs: 2 General stair comments: cues for cane/foot placement    Exercises Total Joint Exercises Ankle Circles/Pumps: AROM;15 reps;Supine;Both Quad Sets: AROM;Both;Supine;20 reps Heel Slides: AAROM;Supine;Left;20 reps Straight Leg Raises: AAROM;Left;Supine;20 reps Goniometric ROM: AAROM at L knee -10 - 65   PT Diagnosis:    PT Problem List:   PT Treatment Interventions:     PT Goals (current goals can now be found in the care plan section) Acute Rehab PT Goals Patient Stated Goal: Resume previous lifestyle with decreased pain PT Goal Formulation: With patient Time For Goal Achievement: 02/23/14 Potential to Achieve Goals: Good  Visit Information  Last PT Received On: 02/18/14 Assistance Needed: +1 History of Present Illness: Pt currently admitted for  L TKA and had L RCR in Dec 2014.    Subjective Data  Subjective: I am feeling so much better Patient Stated Goal: Resume previous lifestyle with decreased pain   Cognition  Cognition Arousal/Alertness: Awake/alert Behavior During Therapy: WFL for tasks assessed/performed Overall Cognitive Status: Within Functional Limits for tasks assessed    Balance     End of Session PT - End of Session Equipment Utilized During Treatment: Gait belt;Left knee immobilizer Activity Tolerance: Patient tolerated treatment well Patient left: in bed;with call bell/phone within reach Nurse Communication: Mobility status   GP     Macauley Mossberg 02/18/2014, 1:27 PM

## 2014-02-18 NOTE — Plan of Care (Signed)
Problem: Discharge Progression Outcomes Goal: Anticoagulant follow-up in place Outcome: Not Applicable Date Met:  02/17/80 xarelto

## 2014-02-22 NOTE — Discharge Summary (Signed)
Physician Discharge Summary   Patient ID: [name] Aguilar MRN: [id number] DOB/AGE: 1951/10/12 63 y.o.  Admit date: 02/15/2014 Discharge date: 02/18/2014  Primary Diagnosis: Osteoarthritis, left knee  Admission Diagnoses:  Past Medical History  Diagnosis Date  . Hypertension   . GERD (gastroesophageal reflux disease)   . Rotator cuff tear     BILATERAL - SEVERE PAIN BOTH SHOULDERS  . 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 Hypercholesteremia   . Hypothyroidism   . Seizures     YEARS AGO - NONE NOW  . Headache(784.0)     hx of a long time ago, none recent  . Arthritis   . Anemia     hx of   Discharge Diagnoses:   Active Problems:   Osteoarthritis of left knee   Total knee replacement status   Acute blood loss anemia  Estimated body mass index is 25.73 kg/(m^2) as calculated from the following:   Height as of this encounter: $RemoveBeforeD'5\' 4"'[id number]$  (1.626 m).   Weight as of this encounter: 68.04 kg (150 lb).  Procedure:  Procedure(s) (LRB): LEFT TOTAL KNEE ARTHROPLASTY (Left)   Consults: None  HPI: Nema Oatley, 63 y.o. female, has a history of pain and functional disability in the left knee due to arthritis and has failed non-surgical conservative treatments for greater than 12 weeks to includeNSAID's and/or analgesics, corticosteriod injections, use of assistive devices and activity modification. Onset of symptoms was gradual, starting >10 years ago with gradually worsening course since that time. The patient noted no past surgery on the left knee(s). Patient currently rates pain in the left knee(s) at 9 out of 10 with activity. Patient has night pain, worsening of pain with activity and weight bearing, pain that interferes with activities of daily living, pain with passive range of motion, crepitus and joint swelling. Patient has evidence of periarticular osteophytes, joint subluxation and joint space narrowing by imaging studies. There is no active infection.   Laboratory  Data: Admission on 02/15/2014, Discharged on 02/18/2014  Component Date Value Ref Range Status  . Sodium 02/16/2014 135* 137 - 147 mEq/L Final  . Potassium 02/16/2014 4.2  3.7 - 5.3 mEq/L Final   Comment: MODERATE HEMOLYSIS                          HEMOLYSIS AT THIS LEVEL MAY AFFECT RESULT                          REPEATED TO VERIFY                          DELTA CHECK NOTED  . Chloride 02/16/2014 95* 96 - 112 mEq/L Final  . CO2 02/16/2014 25  19 - 32 mEq/L Final  . Glucose, Bld 02/16/2014 139* 70 - 99 mg/dL Final  . BUN 02/16/2014 9  6 - 23 mg/dL Final  . Creatinine, Ser 02/16/2014 1.03  0.50 - 1.10 mg/dL Final   Comment: REPEATED TO VERIFY                          DELTA CHECK NOTED  . Calcium 02/16/2014 8.3* 8.4 - 10.5 mg/dL Final  . GFR calc non Af Amer 02/16/2014 57* >90 mL/min Final  . GFR calc Af Amer 02/16/2014 66* >90 mL/min Final   Comment: (NOTE)  The eGFR has been calculated using the CKD EPI equation.                          This calculation has not been validated in all clinical situations.                          eGFR's persistently <90 mL/min signify possible Chronic Kidney                          Disease.  . WBC 02/16/2014 11.5* 4.0 - 10.5 K/uL Final  . RBC 02/16/2014 3.44* 3.87 - 5.11 MIL/uL Final  . Hemoglobin 02/16/2014 11.2* 12.0 - 15.0 g/dL Final   Comment: REPEATED TO VERIFY                          DELTA CHECK NOTED  . HCT 02/16/2014 33.4* 36.0 - 46.0 % Final  . MCV 02/16/2014 97.1  78.0 - 100.0 fL Final  . MCH 02/16/2014 32.6  26.0 - 34.0 pg Final  . MCHC 02/16/2014 33.5  30.0 - 36.0 g/dL Final  . RDW 02/16/2014 14.4  11.5 - 15.5 % Final  . Platelets 02/16/2014 201  150 - 400 K/uL Final   Comment: SPECIMEN CHECKED FOR CLOTS                          REPEATED TO VERIFY                          DELTA CHECK NOTED  . WBC 02/17/2014 11.7* 4.0 - 10.5 K/uL Final  . RBC 02/17/2014 2.92* 3.87 - 5.11 MIL/uL Final  . Hemoglobin 02/17/2014  9.4* 12.0 - 15.0 g/dL Final  . HCT 02/17/2014 28.2* 36.0 - 46.0 % Final  . MCV 02/17/2014 96.6  78.0 - 100.0 fL Final  . MCH 02/17/2014 32.2  26.0 - 34.0 pg Final  . MCHC 02/17/2014 33.3  30.0 - 36.0 g/dL Final  . RDW 02/17/2014 14.1  11.5 - 15.5 % Final  . Platelets 02/17/2014 160  150 - 400 K/uL Final  . Sodium 02/17/2014 136* 137 - 147 mEq/L Final  . Potassium 02/17/2014 3.2* 3.7 - 5.3 mEq/L Final   DELTA CHECK NOTED  . Chloride 02/17/2014 96  96 - 112 mEq/L Final  . CO2 02/17/2014 28  19 - 32 mEq/L Final  . Glucose, Bld 02/17/2014 139* 70 - 99 mg/dL Final  . BUN 02/17/2014 8  6 - 23 mg/dL Final  . Creatinine, Ser 02/17/2014 0.81  0.50 - 1.10 mg/dL Final  . Calcium 02/17/2014 8.6  8.4 - 10.5 mg/dL Final  . GFR calc non Af Amer 02/17/2014 76* >90 mL/min Final  . GFR calc Af Amer 02/17/2014 88* >90 mL/min Final   Comment: (NOTE)                          The eGFR has been calculated using the CKD EPI equation.                          This calculation has not been validated in all clinical situations.  eGFR's persistently <90 mL/min signify possible Chronic Kidney                          Disease.  . WBC 02/18/2014 14.0* 4.0 - 10.5 K/uL Final  . RBC 02/18/2014 2.67* 3.87 - 5.11 MIL/uL Final  . Hemoglobin 02/18/2014 8.5* 12.0 - 15.0 g/dL Final  . HCT 02/18/2014 26.1* 36.0 - 46.0 % Final  . MCV 02/18/2014 97.8  78.0 - 100.0 fL Final  . MCH 02/18/2014 31.8  26.0 - 34.0 pg Final  . MCHC 02/18/2014 32.6  30.0 - 36.0 g/dL Final  . RDW 02/18/2014 14.2  11.5 - 15.5 % Final  . Platelets 02/18/2014 190  150 - 400 K/uL Final  Hospital Outpatient Visit on 02/14/2014  Component Date Value Ref Range Status  . WBC 02/14/2014 12.5* 4.0 - 10.5 K/uL Final  . RBC 02/14/2014 4.43  3.87 - 5.11 MIL/uL Final  . Hemoglobin 02/14/2014 14.5  12.0 - 15.0 g/dL Final  . HCT 02/14/2014 41.7  36.0 - 46.0 % Final  . MCV 02/14/2014 94.1  78.0 - 100.0 fL Final  . MCH 02/14/2014 32.7   26.0 - 34.0 pg Final  . MCHC 02/14/2014 34.8  30.0 - 36.0 g/dL Final  . RDW 02/14/2014 13.9  11.5 - 15.5 % Final  . Platelets 02/14/2014 268  150 - 400 K/uL Final  . MRSA, PCR 02/14/2014 NEGATIVE  NEGATIVE Final  . Staphylococcus aureus 02/14/2014 NEGATIVE  NEGATIVE Final   Comment:                                 The Xpert SA Assay (FDA                          approved for NASAL specimens                          in patients over 63 years of age),                          is one component of                          a comprehensive surveillance                          program.  Test performance has                          been validated by [employer] for patients greater                          than or equal to 46 year old.                          It is not intended                          to diagnose infection nor to  guide or monitor treatment.  Marland Kitchen aPTT 02/14/2014 33  24 - 37 seconds Final  . Sodium 02/14/2014 139  137 - 147 mEq/L Final  . Potassium 02/14/2014 3.4* 3.7 - 5.3 mEq/L Final  . Chloride 02/14/2014 96  96 - 112 mEq/L Final  . CO2 02/14/2014 30  19 - 32 mEq/L Final  . Glucose, Bld 02/14/2014 116* 70 - 99 mg/dL Final  . BUN 02/14/2014 7  6 - 23 mg/dL Final  . Creatinine, Ser 02/14/2014 0.64  0.50 - 1.10 mg/dL Final  . Calcium 02/14/2014 9.8  8.4 - 10.5 mg/dL Final  . Total Protein 02/14/2014 8.0  6.0 - 8.3 g/dL Final  . Albumin 02/14/2014 3.7  3.5 - 5.2 g/dL Final  . AST 02/14/2014 25  0 - 37 U/L Final  . ALT 02/14/2014 12  0 - 35 U/L Final  . Alkaline Phosphatase 02/14/2014 120* 39 - 117 U/L Final  . Total Bilirubin 02/14/2014 0.4  0.3 - 1.2 mg/dL Final  . GFR calc non Af Amer 02/14/2014 >90  >90 mL/min Final  . GFR calc Af Amer 02/14/2014 >90  >90 mL/min Final   Comment: (NOTE)                          The eGFR has been calculated using the CKD EPI equation.                          This calculation has not  been validated in all clinical situations.                          eGFR's persistently <90 mL/min signify possible Chronic Kidney                          Disease.  Marland Kitchen Prothrombin Time 02/14/2014 12.5  11.6 - 15.2 seconds Final  . INR 02/14/2014 0.95  0.00 - 1.49 Final  . ABO/RH(D) 02/14/2014 O POS   Final  . Antibody Screen 02/14/2014 NEG   Final  . Sample Expiration 02/14/2014 02/18/2014   Final  . Color, Urine 02/14/2014 Bana Borgmeyer* YELLOW Final   BIOCHEMICALS MAY BE AFFECTED BY COLOR  . APPearance 02/14/2014 CLOUDY* CLEAR Final  . Specific Gravity, Urine 02/14/2014 1.022  1.005 - 1.030 Final  . pH 02/14/2014 5.5  5.0 - 8.0 Final  . Glucose, UA 02/14/2014 NEGATIVE  NEGATIVE mg/dL Final  . Hgb urine dipstick 02/14/2014 SMALL* NEGATIVE Final  . Bilirubin Urine 02/14/2014 SMALL* NEGATIVE Final  . Ketones, ur 02/14/2014 NEGATIVE  NEGATIVE mg/dL Final  . Protein, ur 02/14/2014 NEGATIVE  NEGATIVE mg/dL Final  . Urobilinogen, UA 02/14/2014 1.0  0.0 - 1.0 mg/dL Final  . Nitrite 02/14/2014 NEGATIVE  NEGATIVE Final  . Leukocytes, UA 02/14/2014 SMALL* NEGATIVE Final  . ABO/RH(D) 02/14/2014 O POS   Final  . Squamous Epithelial / LPF 02/14/2014 RARE  RARE Final  . WBC, UA 02/14/2014 0-2  <3 WBC/hpf Final  . RBC / HPF 02/14/2014 3-6  <3 RBC/hpf Final  . Bacteria, UA 02/14/2014 FEW* RARE Final     X-Rays:Dg Knee Left Port  02/15/2014   CLINICAL DATA Left knee surgery.  EXAM PORTABLE LEFT KNEE - 1-2 VIEW  COMPARISON None.  FINDINGS Patient status post total left knee replacement. No acute bony abnormality identified. No evidence of fracture. Postsurgical changes noted the soft tissues .  Surgical drainage noted.  IMPRESSION Patient status post total left knee replacement with good anatomic alignment.  SIGNATURE  Electronically Signed   By: Marcello Moores  Register   On: 02/15/2014 13:54    EKG: Orders placed during the hospital encounter of 02/15/14  . EKG 12-LEAD  . EKG 12-LEAD     Hospital Course: Crystel Demarco is a 63 y.o. who was admitted to [facility]. They were brought to the operating room on 02/15/2014 and underwent Procedure(s): LEFT TOTAL KNEE ARTHROPLASTY.  Patient tolerated the procedure well and was later transferred to the recovery room and then to the orthopaedic floor for postoperative care.  They were given PO and IV analgesics for pain control following their surgery.  They were given 24 hours of postoperative antibiotics of  Anti-infectives   Start     Dose/Rate Route Frequency Ordered Stop   02/15/14 1600  ceFAZolin (ANCEF) IVPB 1 g/50 mL premix     1 g 100 mL/hr over 30 Minutes Intravenous Every 6 hours 02/15/14 1452 02/15/14 2147   02/15/14 1107  polymyxin B 500,000 Units, bacitracin 50,000 Units in sodium chloride irrigation 0.9 % 500 mL irrigation  Status:  Discontinued       As needed 02/15/14 1107 02/15/14 1221   02/15/14 0941  ceFAZolin (ANCEF) IVPB 2 g/50 mL premix     2 g 100 mL/hr over 30 Minutes Intravenous On call to O.R. 02/15/14 0941 02/15/14 1001     and started on DVT prophylaxis in the form of Xarelto.   PT and OT were ordered for total joint protocol.  Discharge planning consulted to help with postop disposition and equipment needs.  Patient had a fair night on the evening of surgery.  They started to get up OOB with therapy on day one. Hemovac drain was pulled without difficulty.  Continued to work with therapy into day two. Foley catheter was kept until post op day two when the patient starting voiding well on her own. She had poor urine output post op day one. By day three, the patient had progressed with therapy and meeting their goals.  Incision was healing well.  Patient was seen in rounds and was ready to go home.   Discharge Medications: Prior to Admission medications   Medication Sig Start Date End Date Taking? Authorizing Provider  amLODipine (NORVASC) 5 MG tablet Take 5 mg by mouth daily with breakfast.   Yes Historical Provider, MD    atorvastatin (LIPITOR) 80 MG tablet Take 80 mg by mouth daily.   Yes Historical Provider, MD  gabapentin (NEURONTIN) 300 MG capsule Take 300 mg by mouth 3 (three) times daily.   Yes Historical Provider, MD  levothyroxine (SYNTHROID, LEVOTHROID) 75 MCG tablet Take 75 mcg by mouth daily before breakfast.   Yes Historical Provider, MD  tiZANidine (ZANAFLEX) 4 MG tablet Take 4 mg by mouth every 6 (six) hours as needed for muscle spasms.   Yes Historical Provider, MD  docusate sodium 100 MG CAPS Take 100 mg by mouth 2 (two) times daily. 02/17/14   Surina Storts Renelda Loma, PA-C  ferrous sulfate 325 (65 FE) MG tablet Take 1 tablet (325 mg total) by mouth 3 (three) times daily after meals. 02/17/14   Ellory Khurana Renelda Loma, PA-C  oxyCODONE (OXY IR/ROXICODONE) 5 MG immediate release tablet Take 1-3 tablets (5-15 mg total) by mouth every 4 (four) hours as needed for severe pain. 02/17/14   Letisia Schwalb Renelda Loma, PA-C  rivaroxaban (XARELTO) 10 MG TABS tablet Take  1 tablet (10 mg total) by mouth daily with breakfast. 02/17/14   Dhamar Gregory Renelda Loma, PA-C    Diet: Cardiac diet Activity:WBAT Follow-up:in 2 weeks Disposition - Home Discharged Condition: stable   Discharge Orders   Future Orders Complete By Expires   Call MD / Call 911  As directed    Comments:     If you experience chest pain or shortness of breath, CALL 911 and be transported to the hospital emergency room.  If you develope a fever above 101 F, pus (white drainage) or increased drainage or redness at the wound, or calf pain, call your surgeon's office.   Constipation Prevention  As directed    Comments:     Drink plenty of fluids.  Prune juice may be helpful.  You may use a stool softener, such as Colace (over the counter) 100 mg twice a day.  Use MiraLax (over the counter) for constipation as needed.   Diet - low sodium heart healthy  As directed    Discharge instructions  As directed    Comments:     Walk with your walker. Weight  bearing as instructed. [facility] will follow you at home for your therapy  Do no change your dressing unless there is excess drainage Shower only, no tub bath. Call if any temperatures greater than 101 or any wound complications: [phone] during the day and ask for Dr. Charlestine Night nurse, Brunilda Payor.   Do not put a pillow under the knee. Place it under the heel.  As directed    Driving restrictions  As directed    Comments:     No driving   Increase activity slowly as tolerated  As directed        Medication List    STOP taking these medications       ergocalciferol 50000 UNITS capsule  Commonly known as:  VITAMIN D2     ibuprofen 800 MG tablet  Commonly known as:  ADVIL,MOTRIN      TAKE these medications       amLODipine 5 MG tablet  Commonly known as:  NORVASC  Take 5 mg by mouth daily with breakfast.     atorvastatin 80 MG tablet  Commonly known as:  LIPITOR  Take 80 mg by mouth daily.     DSS 100 MG Caps  Take 100 mg by mouth 2 (two) times daily.     ferrous sulfate 325 (65 FE) MG tablet  Take 1 tablet (325 mg total) by mouth 3 (three) times daily after meals.     gabapentin 300 MG capsule  Commonly known as:  NEURONTIN  Take 300 mg by mouth 3 (three) times daily.     levothyroxine 75 MCG tablet  Commonly known as:  SYNTHROID, LEVOTHROID  Take 75 mcg by mouth daily before breakfast.     oxyCODONE 5 MG immediate release tablet  Commonly known as:  Oxy IR/ROXICODONE  Take 1-3 tablets (5-15 mg total) by mouth every 4 (four) hours as needed for severe pain.     rivaroxaban 10 MG Tabs tablet  Commonly known as:  XARELTO  Take 1 tablet (10 mg total) by mouth daily with breakfast.     tiZANidine 4 MG tablet  Commonly known as:  ZANAFLEX  Take 4 mg by mouth every 6 (six) hours as needed for muscle spasms.           Follow-up Information   Follow up with [facility]. [facility]  and occupational therapy)    Contact  information:   [address] [place] [address] [phone]       Follow up with GIOFFRE,RONALD A, MD. Schedule an appointment as soon as possible for a visit in 2 weeks.   Specialty:  Orthopedic Surgery   Contact information:   [address] [place] [address] [phone]       Signed: Malachy Chamber 02/22/2014, 9:39 AM

## 2014-02-27 NOTE — Addendum Note (Signed)
Addendum created 02/27/14 1054 by Einar PheasantAlexander F Lillee Mooneyhan, MD   Modules edited: Anesthesia Responsible Staff

## 2014-04-21 ENCOUNTER — Emergency Department (HOSPITAL_COMMUNITY): Payer: Medicaid Other

## 2014-04-21 ENCOUNTER — Emergency Department (HOSPITAL_COMMUNITY)
Admission: EM | Admit: 2014-04-21 | Discharge: 2014-04-22 | Disposition: A | Discharge status: Home / Self Care | Payer: Medicaid Other | Attending: Emergency Medicine | Admitting: Emergency Medicine

## 2014-04-21 ENCOUNTER — Encounter (HOSPITAL_COMMUNITY): Payer: Self-pay | Admitting: Emergency Medicine

## 2014-04-21 DIAGNOSIS — E039 Hypothyroidism, unspecified: Secondary | ICD-10-CM | POA: Insufficient documentation

## 2014-04-21 DIAGNOSIS — Z79899 Other long term (current) drug therapy: Secondary | ICD-10-CM | POA: Insufficient documentation

## 2014-04-21 DIAGNOSIS — Z7982 Long term (current) use of aspirin: Secondary | ICD-10-CM | POA: Insufficient documentation

## 2014-04-21 DIAGNOSIS — I10 Essential (primary) hypertension: Secondary | ICD-10-CM | POA: Insufficient documentation

## 2014-04-21 DIAGNOSIS — E7800 Pure hypercholesterolemia, unspecified: Secondary | ICD-10-CM | POA: Insufficient documentation

## 2014-04-21 DIAGNOSIS — G40909 Epilepsy, unspecified, not intractable, without status epilepticus: Secondary | ICD-10-CM | POA: Insufficient documentation

## 2014-04-21 DIAGNOSIS — Z87828 Personal history of other (healed) physical injury and trauma: Secondary | ICD-10-CM | POA: Insufficient documentation

## 2014-04-21 DIAGNOSIS — Z862 Personal history of diseases of the blood and blood-forming organs and certain disorders involving the immune mechanism: Secondary | ICD-10-CM | POA: Insufficient documentation

## 2014-04-21 DIAGNOSIS — L089 Local infection of the skin and subcutaneous tissue, unspecified: Secondary | ICD-10-CM

## 2014-04-21 DIAGNOSIS — Z96659 Presence of unspecified artificial knee joint: Secondary | ICD-10-CM | POA: Insufficient documentation

## 2014-04-21 DIAGNOSIS — Z8719 Personal history of other diseases of the digestive system: Secondary | ICD-10-CM | POA: Insufficient documentation

## 2014-04-21 DIAGNOSIS — M129 Arthropathy, unspecified: Secondary | ICD-10-CM | POA: Insufficient documentation

## 2014-04-21 LAB — BASIC METABOLIC PANEL
BUN: 12 mg/dL (ref 6–23)
CO2: 24 meq/L (ref 19–32)
Calcium: 9.7 mg/dL (ref 8.4–10.5)
Chloride: 99 mEq/L (ref 96–112)
Creatinine, Ser: 0.65 mg/dL (ref 0.50–1.10)
GFR calc Af Amer: >90 mL/min (ref >90)
GFR calc non Af Amer: >90 mL/min (ref >90)
GLUCOSE: 97 mg/dL (ref 70–99)
POTASSIUM: 3.2 meq/L — AB (ref 3.7–5.3)
Sodium: 139 mEq/L (ref 137–147)

## 2014-04-21 LAB — SEDIMENTATION RATE: SED RATE: 30 mm/h — AB (ref 0–22)

## 2014-04-21 LAB — CBC WITH DIFFERENTIAL/PLATELET
Basophils Absolute: 0 10*3/uL (ref 0.0–0.1)
Basophils Relative: 0 % (ref 0–1)
Eosinophils Absolute: 0.3 10*3/uL (ref 0.0–0.7)
Eosinophils Relative: 3 % (ref 0–5)
HCT: 39 % (ref 36.0–46.0)
Hemoglobin: 13.2 g/dL (ref 12.0–15.0)
LYMPHS ABS: 2.9 10*3/uL (ref 0.7–4.0)
LYMPHS PCT: 32 % (ref 12–46)
MCH: 30.8 pg (ref 26.0–34.0)
MCHC: 33.8 g/dL (ref 30.0–36.0)
MCV: 91.1 fL (ref 78.0–100.0)
Monocytes Absolute: 0.5 10*3/uL (ref 0.1–1.0)
Monocytes Relative: 6 % (ref 3–12)
NEUTROS PCT: 59 % (ref 43–77)
Neutro Abs: 5.2 10*3/uL (ref 1.7–7.7)
Platelets: 264 10*3/uL (ref 150–400)
RBC: 4.28 MIL/uL (ref 3.87–5.11)
RDW: 14.3 % (ref 11.5–15.5)
WBC: 8.9 10*3/uL (ref 4.0–10.5)

## 2014-04-21 MED ORDER — DOXYCYCLINE HYCLATE 100 MG PO CAPS: 100.0000 mg | ORAL_CAPSULE | Freq: Two times a day (BID) | ORAL | Status: DC

## 2014-04-21 MED ORDER — DOXYCYCLINE HYCLATE 100 MG PO TABS
100.0000 mg | ORAL_TABLET | Freq: Once | ORAL | Status: AC
  Administered 2014-04-22: 100 mg via ORAL
  Filled 2014-04-21: qty 1

## 2014-04-21 NOTE — Discharge Instructions (Signed)
Cellulitis Cellulitis is an infection of the skin and the tissue beneath it. The infected area is usually red and tender. Cellulitis occurs most often in the arms and lower legs.  CAUSES  Cellulitis is caused by bacteria that enter the skin through cracks or cuts in the skin. The most common types of bacteria that cause cellulitis are Staphylococcus and Streptococcus. SYMPTOMS   Redness and warmth.  Swelling.  Tenderness or pain.  Fever. DIAGNOSIS  Your caregiver can usually determine what is wrong based on a physical exam. Blood tests may also be done. TREATMENT  Treatment usually involves taking an antibiotic medicine. HOME CARE INSTRUCTIONS   Take your antibiotics as directed. Finish them even if you start to feel better.  Keep the infected arm or leg elevated to reduce swelling.  Apply a warm cloth to the affected area up to 4 times per day to relieve pain.  Only take over-the-counter or prescription medicines for pain, discomfort, or fever as directed by your caregiver.  Keep all follow-up appointments as directed by your caregiver. SEEK MEDICAL CARE IF:   You notice red streaks coming from the infected area.  Your red area gets larger or turns dark in color.  Your bone or joint underneath the infected area becomes painful after the skin has healed.  Your infection returns in the same area or another area.  You notice a swollen bump in the infected area.  You develop new symptoms. SEEK IMMEDIATE MEDICAL CARE IF:   You have a fever.  You feel very sleepy.  You develop vomiting or diarrhea.  You have a general ill feeling (malaise) with muscle aches and pains. MAKE SURE YOU:   Understand these instructions.  Will watch your condition.  Will get help right away if you are not doing well or get worse. Document Released: 09/03/2005 Document Revised: 05/25/2012 Document Reviewed: 02/09/2012 [employer] Patient Information 2014 [employer].  

## 2014-04-21 NOTE — ED Notes (Signed)
Pt arrived to the ED with a complaint of a post op problem related to her left knee replacement surgery.  Pt states he has green drainage coming out from her wound.  Pt has noticed that the drainage began yesterday.

## 2014-04-21 NOTE — ED Notes (Signed)
Patient transported to X-ray 

## 2014-04-21 NOTE — ED Provider Notes (Signed)
CSN: [id number]     Arrival date & time 04/21/14  2009 History   First MD Initiated Contact with Patient 04/21/14 2102     Chief Complaint  Patient presents with  . Post-op Problem     (Consider location/radiation/quality/duration/timing/severity/associated sxs/prior Treatment) HPI Comments: Presents to the ER for evaluation of itching, pain, swelling and drainage from the left knee. She reports that she had left knee replacement in March. She started to have drainage of thick green material from the wound yesterday. She denies fever.   Past Medical History  Diagnosis Date  . Hypertension   . GERD (gastroesophageal reflux disease)   . Rotator cuff tear     BILATERAL - SEVERE PAIN BOTH SHOULDERS  . 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 Hypercholesteremia   . Hypothyroidism   . Seizures     YEARS AGO - NONE NOW  . Headache(784.0)     hx of a long time ago, none recent  . Arthritis   . Anemia     hx of   Past Surgical History  Procedure Laterality Date  . Shoulder open rotator cuff repair Right 11/09/2013    Procedure: RIGHT ROTATOR CUFF REPAIR SHOULDER OPEN/CLOSED MANIPULATION/ OPEN ACROMINECTOMY,;  Surgeon: Jacki Conesonald A Gioffre, MD;  Location: [facility];  Service: Orthopedics;  Laterality: Right;  . Knee arthroscopy Right   . Abdominal hysterectomy    . 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 No pertinent family history. History  Substance Use Topics  . Smoking status: Never Smoker   . Smokeless tobacco: Current User    Types: Snuff  . Alcohol Use: Yes     Comment: 6 BEERS DAILY and "a little liquor daily"   OB History   Grav Para Term Preterm Abortions TAB SAB Ect Mult Living                 Review of Systems  Constitutional: Negative for fever.  Musculoskeletal: Positive for arthralgias and joint  swelling.  Skin: Positive for wound.  All other systems reviewed and are negative.     Allergies  Review of patient's allergies indicates no known allergies.  Home Medications   Prior to Admission medications   Medication Sig Start Date End Date Taking? Authorizing Provider  amLODipine (NORVASC) 5 MG tablet Take 5 mg by mouth daily with breakfast.   Yes Historical Provider, MD  aspirin EC 81 MG tablet Take 81 mg by mouth daily.   Yes Historical Provider, MD  atorvastatin (LIPITOR) 80 MG tablet Take 80 mg by mouth daily.   Yes Historical Provider, MD  ferrous sulfate 325 (65 FE) MG tablet Take 1 tablet (325 mg total) by mouth 3 (three) times daily after meals. 02/17/14  Yes Amber Tamala SerLauren Constable, PA-C  gabapentin (NEURONTIN) 300 MG capsule Take 300 mg by mouth 3 (three) times daily.   Yes Historical Provider, MD  levothyroxine (SYNTHROID, LEVOTHROID) 75 MCG tablet Take 75 mcg by mouth daily before breakfast.   Yes Historical Provider, MD  oxyCODONE (OXY IR/ROXICODONE) 5 MG immediate release tablet Take 1-3 tablets (5-15 mg total) by mouth every 4 (four) hours as needed for severe pain. 02/17/14  Yes Amber Tamala SerLauren Constable, PA-C  tiZANidine (ZANAFLEX) 4 MG tablet Take 4 mg by mouth every 6 (six) hours as needed for muscle spasms.  Yes Historical Provider, MD  docusate sodium 100 MG CAPS Take 100 mg by mouth 2 (two) times daily. 02/17/14   Amber Lauren Constable, PA-C   BP 159/97  Pulse 119  Temp(Src) 99 F (37.2 C) (Oral)  Resp 16  Ht 5\' 1"  (1.549 m)  Wt 150 lb (68.04 kg)  BMI 28.36 kg/m2  SpO2 98% Physical Exam  Constitutional: She is oriented to person, place, and time. She appears well-developed and well-nourished. No distress.  HENT:  Head: Normocephalic and atraumatic.  Right Ear: Hearing normal.  Left Ear: Hearing normal.  Nose: Nose normal.  Mouth/Throat: Oropharynx is clear and moist and mucous membranes are normal.  Eyes: Conjunctivae and EOM are normal. Pupils are  equal, round, and reactive to light.  Neck: Normal range of motion. Neck supple.  Cardiovascular: Regular rhythm, S1 normal and S2 normal.  Exam reveals no gallop and no friction rub.   No murmur heard. Pulmonary/Chest: Effort normal and breath sounds normal. No respiratory distress. She exhibits no tenderness.  Abdominal: Soft. Normal appearance and bowel sounds are normal. There is no hepatosplenomegaly. There is no tenderness. There is no rebound, no guarding, no tenderness at McBurney's point and negative Murphy's sign. No hernia.  Musculoskeletal: Normal range of motion.  Neurological: She is alert and oriented to person, place, and time. She has normal strength. No cranial nerve deficit or sensory deficit. Coordination normal. GCS eye subscore is 4. GCS verbal subscore is 5. GCS motor subscore is 6.  Skin: Skin is warm, dry and intact. No rash noted. No cyanosis.  Psychiatric: She has a normal mood and affect. Her speech is normal and behavior is normal. Thought content normal.    ED Course  Procedures (including critical care time) Labs Review Labs Reviewed  BASIC METABOLIC PANEL - Abnormal; Notable for the following:    Potassium 3.2 (*)    All other components within normal limits  CBC WITH DIFFERENTIAL  C-REACTIVE PROTEIN  SEDIMENTATION RATE    Imaging Review Dg Knee Complete 4 Views Left  04/21/2014   CLINICAL DATA:  Status post total knee joint replacement in March now with an open draining wound  EXAM: LEFT KNEE - COMPLETE 4+ VIEW  COMPARISON:  DG KNEE*L*PORT dated 02/15/2014  FINDINGS: The interface of the joint prosthesis with the native bone appears normal. There are no findings suspicious for loosening nor infection. There is radiodense material along the anterior aspect of the joint which was not clearly evident previously. There is mild soft tissue swelling anteriorly. There is no soft tissue gas. Cortical discontinuity along the medial aspect of the tibial metaphysis is  present but stable.  IMPRESSION: There is no plain film evidence of loosening or infection involving the interface of the prosthesis with the native bone. No soft tissue gas is demonstrated either.   Electronically Signed   By: David  [place]   On: 04/21/2014 22:23     EKG Interpretation None      MDM   Final diagnoses:  Possible soft tissue infection, status post total knee replacement    Patient presents to the ER for evaluation of drainage from her left knee. Patient had knee replacement surgery 2 months ago. Her convalescent phase has been unremarkable until yesterday when she started noticing some drainage from the knee. She had an area in the middle of the incision that appeared to be delayed healing. There is some scabbing over the area she has been scratching at it because it itches. I do  not see any purulent drainage today. She is not experiencing a fever. Her white count is normal. X-ray was unremarkable. Sedimentation rate and C-reactive protein are pending.  Case discussed with Doctor Aluisio, covering for Doctor Gioffre who performed her surgery. He did feel that it was reasonable to initiate doxycycline as an outpatient and followup on May 19, which is already scheduled, as appropriate. Return to ER if symptoms worsen.    Gilda Creasehristopher J. Alanis Clift, MD 04/21/14 [phone]

## 2014-04-22 LAB — C-REACTIVE PROTEIN: CRP: 0.6 mg/dL — ABNORMAL HIGH (ref <0.60)

## 2014-11-17 IMAGING — CR DG KNEE COMPLETE 4+V*L*
4 series · 4 of 4 positions shown · non-contrast
Comparison: DG KNEE*L*PORT dated 02/15/2014

CLINICAL DATA: Status post total knee joint replacement in [REDACTED]
now with an open draining wound

EXAM:
LEFT KNEE - COMPLETE 4+ VIEW

[x knee ap left (1 of 4)]
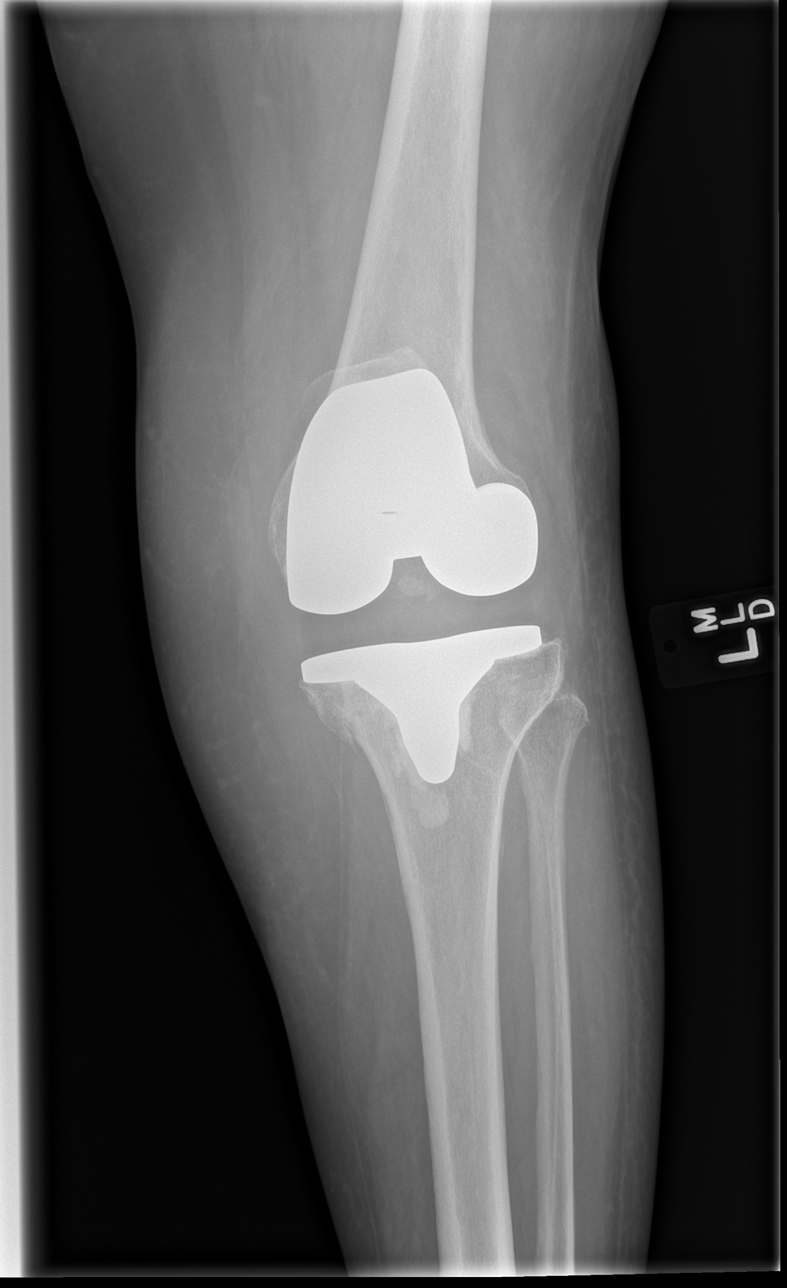

[x knee ap left (2 of 4)]
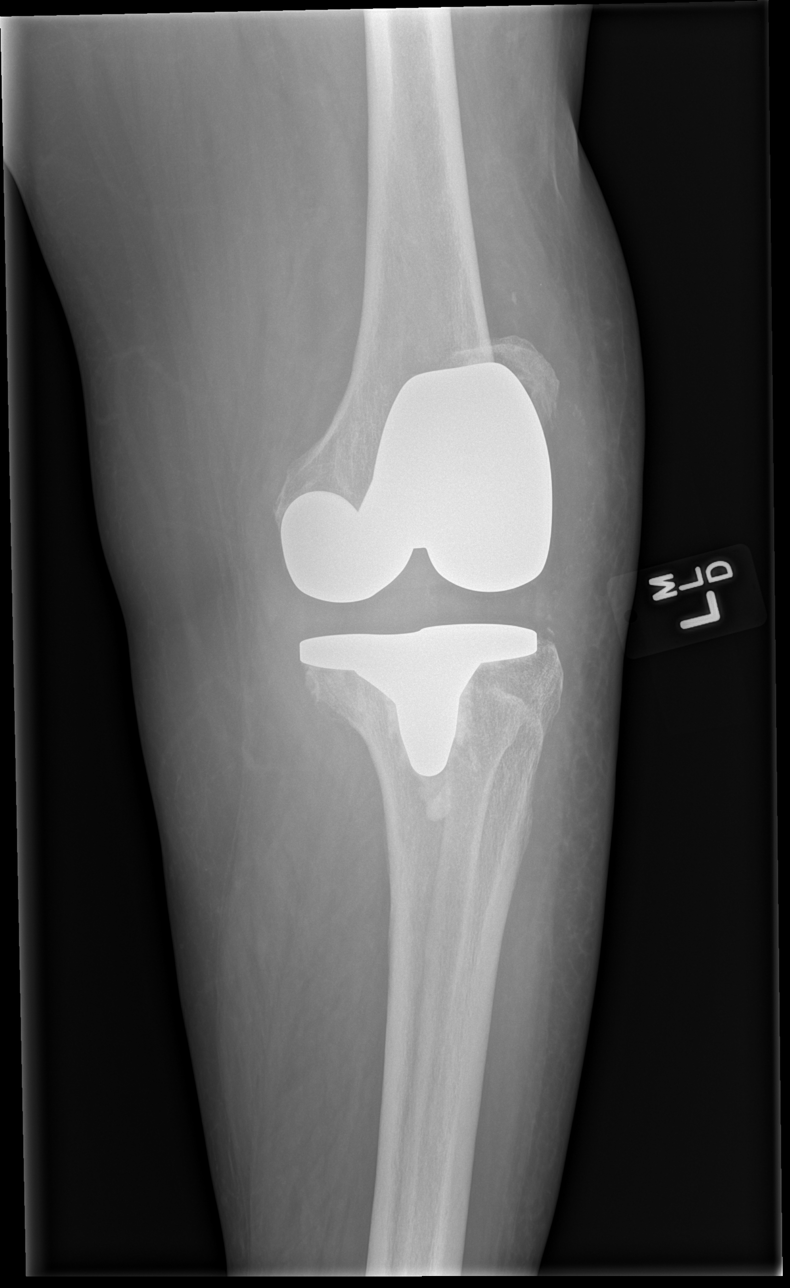

[x knee ap left (3 of 4)]
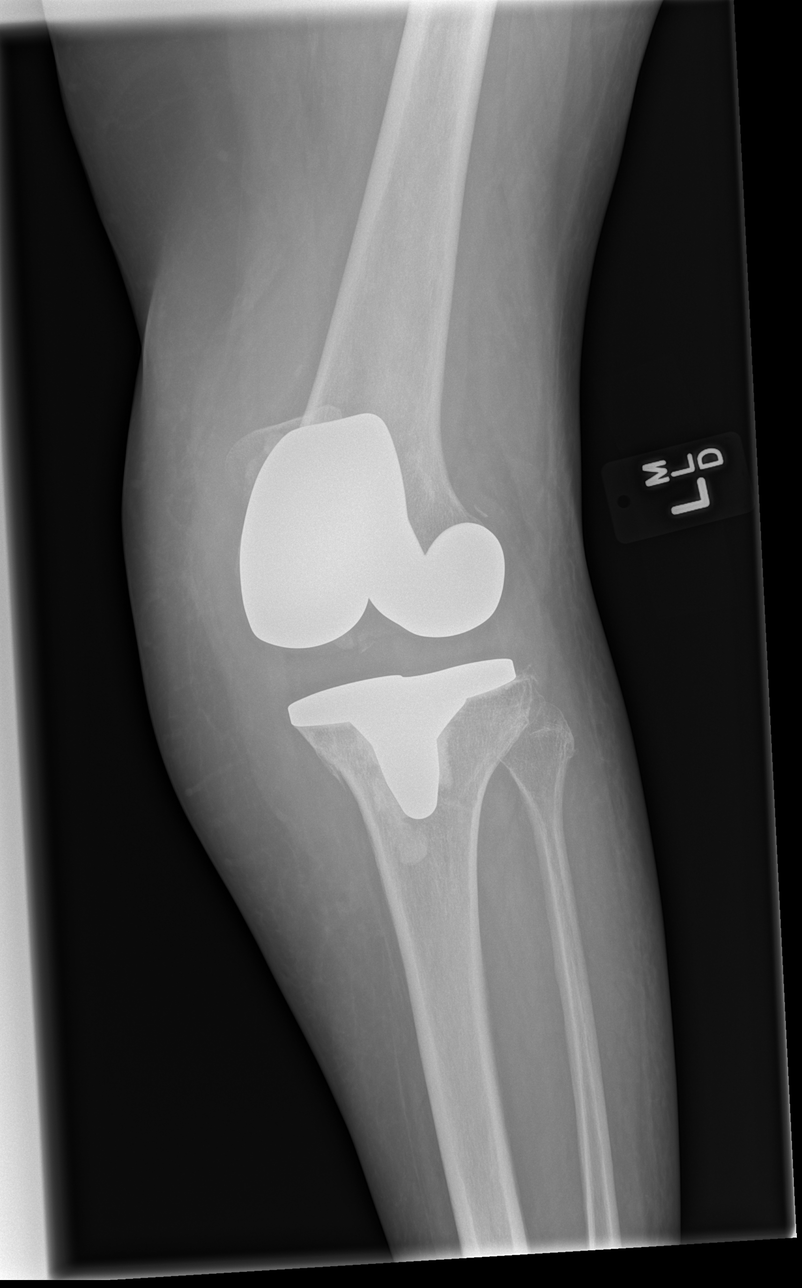

[x knee ap left (4 of 4)]
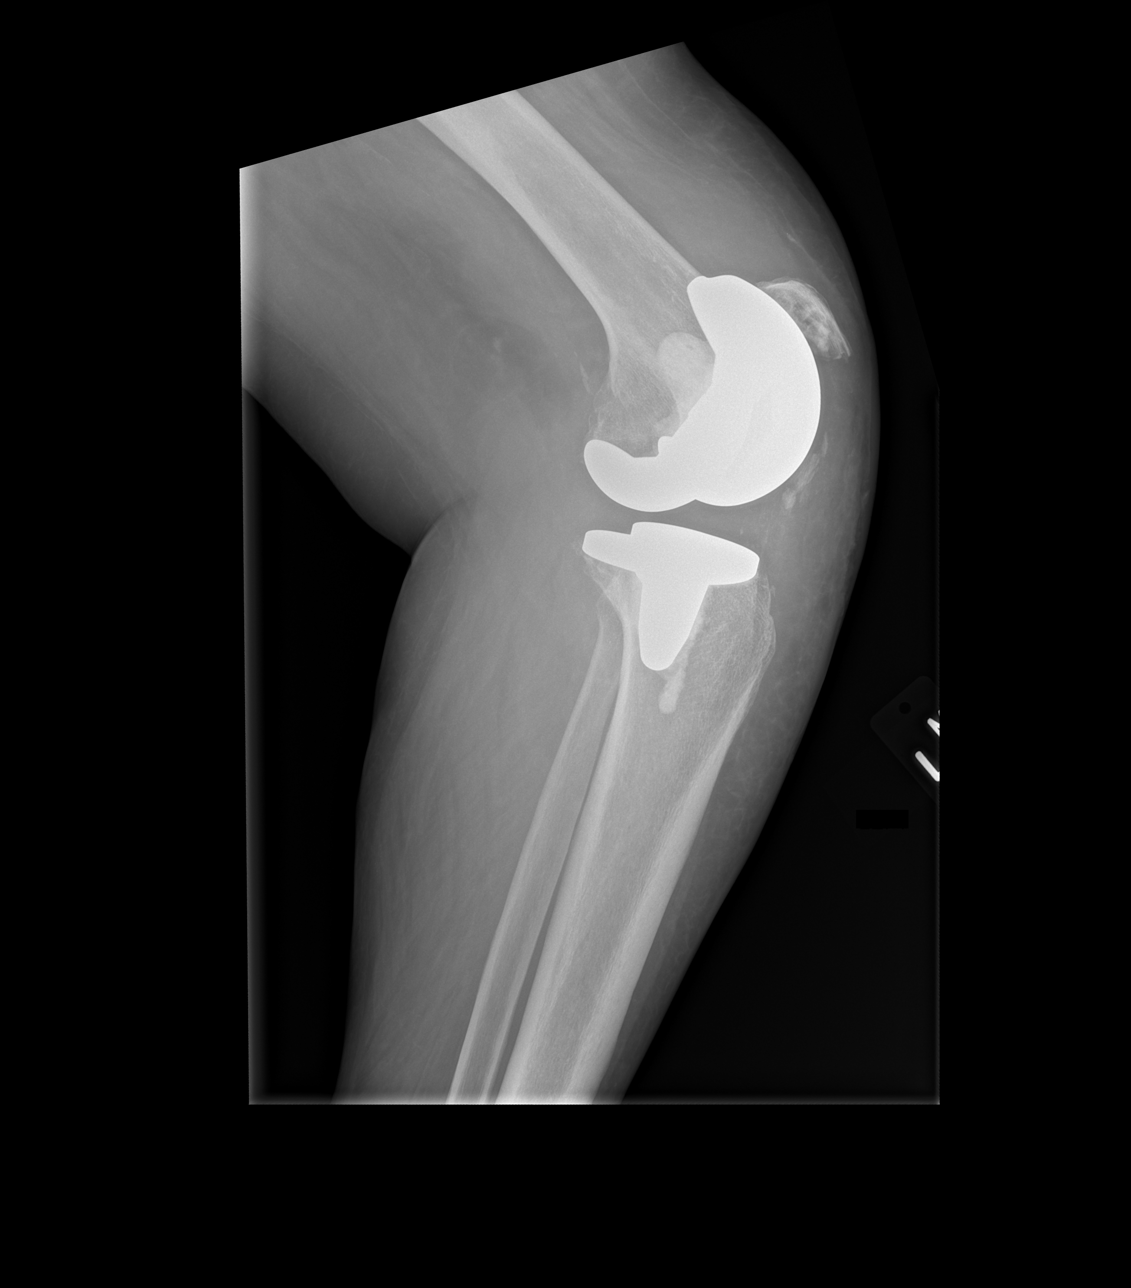

[4 of 4 positions shown; findings below may reference images not displayed]

FINDINGS: The interface of the joint prosthesis with the native bone appears
normal. There are no findings suspicious for loosening nor
infection. There is radiodense material along the anterior aspect of
the joint which was not clearly evident previously. There is mild
soft tissue swelling anteriorly. There is no soft tissue gas.
Cortical discontinuity along the medial aspect of the tibial
metaphysis is present but stable.
IMPRESSION: There is no plain film evidence of loosening or infection involving
the interface of the prosthesis with the native bone. No soft tissue
gas is demonstrated either.

## 2014-11-23 ENCOUNTER — Other Ambulatory Visit: Payer: Self-pay | Admitting: Surgical

## 2015-01-17 NOTE — Patient Instructions (Addendum)
[name] Aguilar  01/17/2015   Your procedure is scheduled on:01/31/2015    [facility] and follow signs to               Short Stay Center at       0630 AM.  Call this number if you have problems the morning of surgery [phone]   Remember:  Do not eat food or drink liquids :After Midnight.     Take these medicines the morning of surgery with A SIP OF WATER: amlodipine ( Norvasc), Synthroid                                You may not have any metal on your body including hair pins and              piercings  Do not wear jewelry, make-up, lotions, powders or perfumes.             Do not wear nail polish.  Do not shave  48 hours prior to surgery.               Do not bring valuables to the hospital.  IS NOT             RESPONSIBLE   FOR VALUABLES.  Contacts, dentures or bridgework may not be worn into surgery.  Leave suitcase in the car. After surgery it may be brought to your room.       Special Instructions: coughing and deep breathing exercises, leg exercises               Please read over the following fact sheets you were given: _____________________________________________________________________             [facility] - Preparing for Surgery Before surgery, you can play an important role.  Because skin is not sterile, your skin needs to be as free of germs as possible.  You can reduce the number of germs on your skin by washing with CHG (chlorahexidine gluconate) soap before surgery.  CHG is an antiseptic cleaner which kills germs and bonds with the skin to continue killing germs even after washing. Please DO NOT use if you have an allergy to CHG or antibacterial soaps.  If your skin becomes reddened/irritated stop using the CHG and inform your nurse when you arrive at Short Stay. Do not shave (including legs and underarms) for at least 48 hours prior to the first CHG shower.  You may shave your face/neck. Please  follow these instructions carefully:  1.  Shower with CHG Soap the night before surgery and the  morning of Surgery.  2.  If you choose to wash your hair, wash your hair first as usual with your  normal  shampoo.  3.  After you shampoo, rinse your hair and body thoroughly to remove the  shampoo.                           4.  Use CHG as you would any other liquid soap.  You can apply chg directly  to the skin and wash                       Gently with a scrungie or clean washcloth.  5.  Apply the  CHG Soap to your body ONLY FROM THE NECK DOWN.   Do not use on face/ open                           Wound or open sores. Avoid contact with eyes, ears mouth and genitals (private parts).                       Wash face,  Genitals (private parts) with your normal soap.             6.  Wash thoroughly, paying special attention to the area where your surgery  will be performed.  7.  Thoroughly rinse your body with warm water from the neck down.  8.  DO NOT shower/wash with your normal soap after using and rinsing off  the CHG Soap.                9.  Pat yourself dry with a clean towel.            10.  Wear clean pajamas.            11.  Place clean sheets on your bed the night of your first shower and do not  sleep with pets. Day of Surgery : Do not apply any lotions/deodorants the morning of surgery.  Please wear clean clothes to the hospital/surgery center.  FAILURE TO FOLLOW THESE INSTRUCTIONS MAY RESULT IN THE CANCELLATION OF YOUR SURGERY PATIENT SIGNATURE_________________________________  NURSE SIGNATURE__________________________________  ________________________________________________________________________   Adam Phenix  An incentive spirometer is a tool that can help keep your lungs clear and active. This tool measures how well you are filling your lungs with each breath. Taking long deep breaths may help reverse or decrease the chance of developing breathing (pulmonary) problems  (especially infection) following:  A long period of time when you are unable to move or be active. BEFORE THE PROCEDURE   If the spirometer includes an indicator to show your best effort, your nurse or respiratory therapist will set it to a desired goal.  If possible, sit up straight or lean slightly forward. Try not to slouch.  Hold the incentive spirometer in an upright position. INSTRUCTIONS FOR USE  1. Sit on the edge of your bed if possible, or sit up as far as you can in bed or on a chair. 2. Hold the incentive spirometer in an upright position. 3. Breathe out normally. 4. Place the mouthpiece in your mouth and seal your lips tightly around it. 5. Breathe in slowly and as deeply as possible, raising the piston or the ball toward the top of the column. 6. Hold your breath for 3-5 seconds or for as long as possible. Allow the piston or ball to fall to the bottom of the column. 7. Remove the mouthpiece from your mouth and breathe out normally. 8. Rest for a few seconds and repeat Steps 1 through 7 at least 10 times every 1-2 hours when you are awake. Take your time and take a few normal breaths between deep breaths. 9. The spirometer may include an indicator to show your best effort. Use the indicator as a goal to work toward during each repetition. 10. After each set of 10 deep breaths, practice coughing to be sure your lungs are clear. If you have an incision (the cut made at the time of surgery), support your incision when coughing by placing a pillow or rolled up  towels firmly against it. Once you are able to get out of bed, walk around indoors and cough well. You may stop using the incentive spirometer when instructed by your caregiver.  RISKS AND COMPLICATIONS  Take your time so you do not get dizzy or light-headed.  If you are in pain, you may need to take or ask for pain medication before doing incentive spirometry. It is harder to take a deep breath if you are having  pain. AFTER USE  Rest and breathe slowly and easily.  It can be helpful to keep track of a log of your progress. Your caregiver can provide you with a simple table to help with this. If you are using the spirometer at home, follow these instructions: SEEK MEDICAL CARE IF:   You are having difficultly using the spirometer.  You have trouble using the spirometer as often as instructed.  Your pain medication is not giving enough relief while using the spirometer.  You develop fever of 100.5 F (38.1 C) or higher. SEEK IMMEDIATE MEDICAL CARE IF:   You cough up bloody sputum that had not been present before.  You develop fever of 102 F (38.9 C) or greater.  You develop worsening pain at or near the incision site. MAKE SURE YOU:   Understand these instructions.  Will watch your condition.  Will get help right away if you are not doing well or get worse. Document Released: 04/06/2007 Document Revised: 02/16/2012 Document Reviewed: 06/07/2007 [facility] Patient Information 2014 [employer] [place].   ________________________________________________________________________

## 2015-01-18 ENCOUNTER — Ambulatory Visit (HOSPITAL_COMMUNITY)
Admission: RE | Admit: 2015-01-18 | Discharge: 2015-01-18 | Disposition: A | Discharge status: Home / Self Care | Payer: Medicaid Other | Source: Ambulatory Visit | Attending: Surgical | Admitting: Surgical

## 2015-01-18 ENCOUNTER — Encounter (HOSPITAL_COMMUNITY): Payer: Self-pay

## 2015-01-18 ENCOUNTER — Encounter (HOSPITAL_COMMUNITY)
Admission: RE | Admit: 2015-01-18 | Discharge: 2015-01-18 | Disposition: A | Discharge status: Home / Self Care | Payer: Medicaid Other | Source: Ambulatory Visit | Attending: Orthopedic Surgery | Admitting: Orthopedic Surgery

## 2015-01-18 DIAGNOSIS — Z01818 Encounter for other preprocedural examination: Secondary | ICD-10-CM | POA: Insufficient documentation

## 2015-01-18 DIAGNOSIS — I10 Essential (primary) hypertension: Secondary | ICD-10-CM | POA: Diagnosis not present

## 2015-01-18 LAB — CBC WITH DIFFERENTIAL/PLATELET
Basophils Absolute: 0 10*3/uL (ref 0.0–0.1)
Basophils Relative: 0 % (ref 0–1)
Eosinophils Absolute: 0 10*3/uL (ref 0.0–0.7)
Eosinophils Relative: 0 % (ref 0–5)
HCT: 41.3 % (ref 36.0–46.0)
Hemoglobin: 14.1 g/dL (ref 12.0–15.0)
Lymphocytes Relative: 14 % (ref 12–46)
Lymphs Abs: 1.4 10*3/uL (ref 0.7–4.0)
MCH: 33.3 pg (ref 26.0–34.0)
MCHC: 34.1 g/dL (ref 30.0–36.0)
MCV: 97.6 fL (ref 78.0–100.0)
Monocytes Absolute: 0.9 10*3/uL (ref 0.1–1.0)
Monocytes Relative: 9 % (ref 3–12)
Neutro Abs: 7.6 10*3/uL (ref 1.7–7.7)
Neutrophils Relative %: 77 % (ref 43–77)
Platelets: 278 10*3/uL (ref 150–400)
RBC: 4.23 MIL/uL (ref 3.87–5.11)
RDW: 13.2 % (ref 11.5–15.5)
WBC: 9.9 10*3/uL (ref 4.0–10.5)

## 2015-01-18 LAB — COMPREHENSIVE METABOLIC PANEL
ALT: 13 U/L (ref 0–35)
AST: 23 U/L (ref 0–37)
Albumin: 3.9 g/dL (ref 3.5–5.2)
Alkaline Phosphatase: 86 U/L (ref 39–117)
Anion gap: 10 (ref 5–15)
BUN: 8 mg/dL (ref 6–23)
CO2: 30 mmol/L (ref 19–32)
Calcium: 9.8 mg/dL (ref 8.4–10.5)
Chloride: 99 mmol/L (ref 96–112)
Creatinine, Ser: 0.74 mg/dL (ref 0.50–1.10)
GFR calc Af Amer: >90 mL/min (ref >90)
GFR calc non Af Amer: 88 mL/min — ABNORMAL LOW (ref >90)
Glucose, Bld: 107 mg/dL — ABNORMAL HIGH (ref 70–99)
Potassium: 2.8 mmol/L — ABNORMAL LOW (ref 3.5–5.1)
Sodium: 139 mmol/L (ref 135–145)
Total Bilirubin: 0.8 mg/dL (ref 0.3–1.2)
Total Protein: 8.5 g/dL — ABNORMAL HIGH (ref 6.0–8.3)

## 2015-01-18 LAB — URINE MICROSCOPIC-ADD ON

## 2015-01-18 LAB — URINALYSIS, ROUTINE W REFLEX MICROSCOPIC
Glucose, UA: NEGATIVE mg/dL
Ketones, ur: NEGATIVE mg/dL
Nitrite: NEGATIVE
Protein, ur: NEGATIVE mg/dL
Specific Gravity, Urine: 1.024 (ref 1.005–1.030)
Urobilinogen, UA: 0.2 mg/dL (ref 0.0–1.0)
pH: 5.5 (ref 5.0–8.0)

## 2015-01-18 LAB — PROTIME-INR
INR: 0.96 (ref 0.00–1.49)
Prothrombin Time: 12.9 seconds (ref 11.6–15.2)

## 2015-01-18 LAB — APTT: aPTT: 32 seconds (ref 24–37)

## 2015-01-18 NOTE — Progress Notes (Signed)
Called office of Dr Darrelyn HillockGioffre and office stated per Dr Darrelyn HillockGioffre that they had received abnormal lab value of Potassium 2.8 done 01/18/2015  and that MD was contacting patient to have her increase her potassium .

## 2015-01-18 NOTE — Progress Notes (Signed)
U/A and micro results along with CMP done 01/18/2015 faxed via EPIC to Dr Darrelyn HillockGioffre .

## 2015-01-18 NOTE — Progress Notes (Signed)
Potassium 2. 8 on CMP drawn 01/18/2015.  FAxed via EPIC to [phone].  Called office and asked to speak to Dr Darrelyn HillockGioffre or to PA of Dr Darrelyn HillockGioffre.  Informed office staff that abnormal labs had been faxed to office and that I needed to speak with MD or PA.  She came back to phone to inform me they were looking for labs.  Informed that potassium was 2.8 on labs done today of 01/18/15  Juleen [name] patient of Dr Darrelyn HillockGioffre.  She stated they would look for labs from fax and call me if they did not receive.

## 2015-01-22 NOTE — Progress Notes (Signed)
Final EKG done 01/18/15 on chart.

## 2015-01-31 ENCOUNTER — Encounter (HOSPITAL_COMMUNITY): Payer: Self-pay | Admitting: Anesthesiology

## 2015-01-31 ENCOUNTER — Ambulatory Visit (HOSPITAL_COMMUNITY): Payer: Medicaid Other | Admitting: Anesthesiology

## 2015-01-31 ENCOUNTER — Encounter (HOSPITAL_COMMUNITY)
Admission: RE | Disposition: A | Discharge status: Home / Self Care | Payer: Self-pay | Source: Ambulatory Visit | Attending: Orthopedic Surgery

## 2015-01-31 ENCOUNTER — Observation Stay (HOSPITAL_COMMUNITY)
Admission: RE | Admit: 2015-01-31 | Discharge: 2015-02-01 | Disposition: A | Discharge status: Home / Self Care | Payer: Medicaid Other | Source: Ambulatory Visit | Attending: Orthopedic Surgery | Admitting: Orthopedic Surgery

## 2015-01-31 DIAGNOSIS — E039 Hypothyroidism, unspecified: Secondary | ICD-10-CM | POA: Insufficient documentation

## 2015-01-31 DIAGNOSIS — Z7982 Long term (current) use of aspirin: Secondary | ICD-10-CM | POA: Insufficient documentation

## 2015-01-31 DIAGNOSIS — M75122 Complete rotator cuff tear or rupture of left shoulder, not specified as traumatic: Secondary | ICD-10-CM | POA: Diagnosis present

## 2015-01-31 DIAGNOSIS — I10 Essential (primary) hypertension: Secondary | ICD-10-CM | POA: Diagnosis not present

## 2015-01-31 DIAGNOSIS — D649 Anemia, unspecified: Secondary | ICD-10-CM | POA: Diagnosis not present

## 2015-01-31 DIAGNOSIS — K219 Gastro-esophageal reflux disease without esophagitis: Secondary | ICD-10-CM | POA: Insufficient documentation

## 2015-01-31 DIAGNOSIS — M75100 Unspecified rotator cuff tear or rupture of unspecified shoulder, not specified as traumatic: Secondary | ICD-10-CM | POA: Diagnosis present

## 2015-01-31 DIAGNOSIS — E780 Pure hypercholesterolemia: Secondary | ICD-10-CM | POA: Insufficient documentation

## 2015-01-31 DIAGNOSIS — R569 Unspecified convulsions: Secondary | ICD-10-CM | POA: Diagnosis not present

## 2015-01-31 DIAGNOSIS — Z79899 Other long term (current) drug therapy: Secondary | ICD-10-CM | POA: Insufficient documentation

## 2015-01-31 HISTORY — PX: SHOULDER OPEN ROTATOR CUFF REPAIR: SHX2407

## 2015-01-31 SURGERY — REPAIR, ROTATOR CUFF, OPEN
Anesthesia: General | Site: Shoulder | Laterality: Left

## 2015-01-31 MED ORDER — AMLODIPINE BESYLATE 5 MG PO TABS
5.0000 mg | ORAL_TABLET | Freq: Every day | ORAL | Status: DC
  Administered 2015-02-01: 5 mg via ORAL
  Filled 2015-01-31 (×2): qty 1

## 2015-01-31 MED ORDER — FENTANYL CITRATE 0.05 MG/ML IJ SOLN
25.0000 ug | INTRAMUSCULAR | Status: DC | PRN
  Administered 2015-01-31 (×4): 25 ug via INTRAVENOUS

## 2015-01-31 MED ORDER — PROPOFOL 10 MG/ML IV BOLUS
INTRAVENOUS | Status: DC | PRN
  Administered 2015-01-31: 150 mg via INTRAVENOUS

## 2015-01-31 MED ORDER — LABETALOL HCL 5 MG/ML IV SOLN
INTRAVENOUS | Status: DC | PRN
  Administered 2015-01-31: 5 mg via INTRAVENOUS

## 2015-01-31 MED ORDER — ACETAMINOPHEN 650 MG RE SUPP: 650.0000 mg | Freq: Four times a day (QID) | RECTAL | Status: DC | PRN

## 2015-01-31 MED ORDER — LIDOCAINE HCL (CARDIAC) 20 MG/ML IV SOLN
INTRAVENOUS | Status: AC
  Filled 2015-01-31: qty 5

## 2015-01-31 MED ORDER — ACETAMINOPHEN 10 MG/ML IV SOLN
1000.0000 mg | Freq: Once | INTRAVENOUS | Status: AC
  Administered 2015-01-31: 1000 mg via INTRAVENOUS
  Filled 2015-01-31: qty 100

## 2015-01-31 MED ORDER — BISACODYL 5 MG PO TBEC: 5.0000 mg | DELAYED_RELEASE_TABLET | Freq: Every day | ORAL | Status: DC | PRN

## 2015-01-31 MED ORDER — HYDROMORPHONE HCL 1 MG/ML IJ SOLN
INTRAMUSCULAR | Status: DC | PRN
  Administered 2015-01-31 (×2): 0.5 mg via INTRAVENOUS
  Administered 2015-01-31: 1 mg via INTRAVENOUS

## 2015-01-31 MED ORDER — SODIUM CHLORIDE 0.9 % IR SOLN
Status: DC | PRN
  Administered 2015-01-31: 500 mL

## 2015-01-31 MED ORDER — METHOCARBAMOL 1000 MG/10ML IJ SOLN
500.0000 mg | Freq: Four times a day (QID) | INTRAVENOUS | Status: DC | PRN
  Administered 2015-01-31: 500 mg via INTRAVENOUS
  Filled 2015-01-31 (×2): qty 5

## 2015-01-31 MED ORDER — EPHEDRINE SULFATE 50 MG/ML IJ SOLN
INTRAMUSCULAR | Status: AC
  Filled 2015-01-31: qty 1

## 2015-01-31 MED ORDER — CEFAZOLIN SODIUM-DEXTROSE 2-3 GM-% IV SOLR
INTRAVENOUS | Status: AC
  Filled 2015-01-31: qty 50

## 2015-01-31 MED ORDER — CEFAZOLIN SODIUM 1-5 GM-% IV SOLN
1.0000 g | Freq: Four times a day (QID) | INTRAVENOUS | Status: AC
  Administered 2015-01-31 – 2015-02-01 (×3): 1 g via INTRAVENOUS
  Filled 2015-01-31 (×3): qty 50

## 2015-01-31 MED ORDER — GLYCOPYRROLATE 0.2 MG/ML IJ SOLN
INTRAMUSCULAR | Status: DC | PRN
  Administered 2015-01-31: 0.4 mg via INTRAVENOUS

## 2015-01-31 MED ORDER — GABAPENTIN 300 MG PO CAPS
300.0000 mg | ORAL_CAPSULE | Freq: Two times a day (BID) | ORAL | Status: DC
  Administered 2015-01-31 – 2015-02-01 (×2): 300 mg via ORAL
  Filled 2015-01-31 (×3): qty 1

## 2015-01-31 MED ORDER — ROCURONIUM BROMIDE 100 MG/10ML IV SOLN
INTRAVENOUS | Status: DC | PRN
  Administered 2015-01-31: 40 mg via INTRAVENOUS

## 2015-01-31 MED ORDER — FENTANYL CITRATE 0.05 MG/ML IJ SOLN
INTRAMUSCULAR | Status: DC | PRN
  Administered 2015-01-31: 100 ug via INTRAVENOUS
  Administered 2015-01-31: 50 ug via INTRAVENOUS
  Administered 2015-01-31: 100 ug via INTRAVENOUS

## 2015-01-31 MED ORDER — ROCURONIUM BROMIDE 100 MG/10ML IV SOLN
INTRAVENOUS | Status: AC
  Filled 2015-01-31: qty 1

## 2015-01-31 MED ORDER — ONDANSETRON HCL 4 MG PO TABS: 4.0000 mg | ORAL_TABLET | Freq: Four times a day (QID) | ORAL | Status: DC | PRN

## 2015-01-31 MED ORDER — PHENYLEPHRINE HCL 10 MG/ML IJ SOLN
INTRAMUSCULAR | Status: AC
  Filled 2015-01-31: qty 1

## 2015-01-31 MED ORDER — METOCLOPRAMIDE HCL 10 MG PO TABS: 5.0000 mg | ORAL_TABLET | Freq: Three times a day (TID) | ORAL | Status: DC | PRN

## 2015-01-31 MED ORDER — HYDROCODONE-ACETAMINOPHEN 5-325 MG PO TABS
1.0000 | ORAL_TABLET | ORAL | Status: DC | PRN
  Administered 2015-01-31: 2 via ORAL
  Administered 2015-01-31 (×2): 1 via ORAL
  Administered 2015-02-01: 2 via ORAL
  Filled 2015-01-31 (×2): qty 2
  Filled 2015-01-31 (×2): qty 1

## 2015-01-31 MED ORDER — ONDANSETRON HCL 4 MG/2ML IJ SOLN: 4.0000 mg | Freq: Four times a day (QID) | INTRAMUSCULAR | Status: DC | PRN

## 2015-01-31 MED ORDER — THROMBIN 5000 UNITS EX SOLR
CUTANEOUS | Status: AC
  Filled 2015-01-31: qty 5000

## 2015-01-31 MED ORDER — ONDANSETRON HCL 4 MG/2ML IJ SOLN
INTRAMUSCULAR | Status: DC | PRN
  Administered 2015-01-31: 4 mg via INTRAVENOUS

## 2015-01-31 MED ORDER — GLYCOPYRROLATE 0.2 MG/ML IJ SOLN
INTRAMUSCULAR | Status: AC
  Filled 2015-01-31: qty 2

## 2015-01-31 MED ORDER — FENTANYL CITRATE 0.05 MG/ML IJ SOLN
INTRAMUSCULAR | Status: AC
  Filled 2015-01-31: qty 2

## 2015-01-31 MED ORDER — MIDAZOLAM HCL 5 MG/5ML IJ SOLN
INTRAMUSCULAR | Status: DC | PRN
  Administered 2015-01-31: 2 mg via INTRAVENOUS

## 2015-01-31 MED ORDER — PHENOL 1.4 % MT LIQD: 1.0000 | OROMUCOSAL | Status: DC | PRN

## 2015-01-31 MED ORDER — MENTHOL 3 MG MT LOZG: 1.0000 | LOZENGE | OROMUCOSAL | Status: DC | PRN

## 2015-01-31 MED ORDER — HYDROMORPHONE HCL 2 MG/ML IJ SOLN
INTRAMUSCULAR | Status: AC
  Filled 2015-01-31: qty 1

## 2015-01-31 MED ORDER — MEPERIDINE HCL 50 MG/ML IJ SOLN: 6.2500 mg | INTRAMUSCULAR | Status: DC | PRN

## 2015-01-31 MED ORDER — HYDROMORPHONE HCL 1 MG/ML IJ SOLN: 0.5000 mg | INTRAMUSCULAR | Status: DC | PRN

## 2015-01-31 MED ORDER — DEXAMETHASONE SODIUM PHOSPHATE 10 MG/ML IJ SOLN
INTRAMUSCULAR | Status: DC | PRN
  Administered 2015-01-31: 10 mg via INTRAVENOUS

## 2015-01-31 MED ORDER — MIDAZOLAM HCL 2 MG/2ML IJ SOLN
INTRAMUSCULAR | Status: AC
  Filled 2015-01-31: qty 2

## 2015-01-31 MED ORDER — THROMBIN 5000 UNITS EX SOLR
CUTANEOUS | Status: DC | PRN
Start: 2015-01-31 — End: 2015-01-31
  Administered 2015-01-31: 5000 [IU] via TOPICAL

## 2015-01-31 MED ORDER — LACTATED RINGERS IV SOLN
INTRAVENOUS | Status: DC | PRN
  Administered 2015-01-31 (×2): via INTRAVENOUS

## 2015-01-31 MED ORDER — METHOCARBAMOL 500 MG PO TABS: 500.0000 mg | ORAL_TABLET | Freq: Four times a day (QID) | ORAL | Status: DC | PRN

## 2015-01-31 MED ORDER — PROMETHAZINE HCL 25 MG/ML IJ SOLN: 6.2500 mg | INTRAMUSCULAR | Status: DC | PRN

## 2015-01-31 MED ORDER — OXYCODONE HCL 5 MG PO TABS
5.0000 mg | ORAL_TABLET | ORAL | Status: DC | PRN
  Filled 2015-01-31: qty 1

## 2015-01-31 MED ORDER — LACTATED RINGERS IV SOLN
INTRAVENOUS | Status: DC
  Administered 2015-01-31: 100 mL/h via INTRAVENOUS

## 2015-01-31 MED ORDER — FENTANYL CITRATE 0.05 MG/ML IJ SOLN
INTRAMUSCULAR | Status: AC
  Filled 2015-01-31: qty 5

## 2015-01-31 MED ORDER — NEOSTIGMINE METHYLSULFATE 10 MG/10ML IV SOLN
INTRAVENOUS | Status: AC
  Filled 2015-01-31: qty 1

## 2015-01-31 MED ORDER — LEVOTHYROXINE SODIUM 75 MCG PO TABS
75.0000 ug | ORAL_TABLET | Freq: Every day | ORAL | Status: DC
  Filled 2015-01-31 (×2): qty 1

## 2015-01-31 MED ORDER — PROPOFOL 10 MG/ML IV BOLUS
INTRAVENOUS | Status: AC
  Filled 2015-01-31: qty 20

## 2015-01-31 MED ORDER — ACETAMINOPHEN 325 MG PO TABS: 650.0000 mg | ORAL_TABLET | Freq: Four times a day (QID) | ORAL | Status: DC | PRN

## 2015-01-31 MED ORDER — NEOSTIGMINE METHYLSULFATE 10 MG/10ML IV SOLN
INTRAVENOUS | Status: DC | PRN
Start: 2015-01-31 — End: 2015-01-31
  Administered 2015-01-31: 3 mg via INTRAVENOUS

## 2015-01-31 MED ORDER — POTASSIUM CHLORIDE CRYS ER 20 MEQ PO TBCR
20.0000 meq | EXTENDED_RELEASE_TABLET | Freq: Every day | ORAL | Status: DC
  Administered 2015-01-31 – 2015-02-01 (×2): 20 meq via ORAL
  Filled 2015-01-31 (×2): qty 1

## 2015-01-31 MED ORDER — ONDANSETRON HCL 4 MG/2ML IJ SOLN
INTRAMUSCULAR | Status: AC
  Filled 2015-01-31: qty 2

## 2015-01-31 MED ORDER — CEFAZOLIN SODIUM-DEXTROSE 2-3 GM-% IV SOLR
2.0000 g | INTRAVENOUS | Status: AC
  Administered 2015-01-31: 2 g via INTRAVENOUS

## 2015-01-31 MED ORDER — SODIUM CHLORIDE 0.9 % IJ SOLN
INTRAMUSCULAR | Status: AC
  Filled 2015-01-31: qty 10

## 2015-01-31 MED ORDER — LACTATED RINGERS IV SOLN: INTRAVENOUS | Status: DC

## 2015-01-31 MED ORDER — METOCLOPRAMIDE HCL 5 MG/ML IJ SOLN: 5.0000 mg | Freq: Three times a day (TID) | INTRAMUSCULAR | Status: DC | PRN

## 2015-01-31 MED ORDER — SENNOSIDES-DOCUSATE SODIUM 8.6-50 MG PO TABS: 1.0000 | ORAL_TABLET | Freq: Every evening | ORAL | Status: DC | PRN

## 2015-01-31 MED ORDER — LIDOCAINE HCL (CARDIAC) 20 MG/ML IV SOLN
INTRAVENOUS | Status: DC | PRN
  Administered 2015-01-31: 50 mg via INTRAVENOUS

## 2015-01-31 MED ORDER — BUPIVACAINE LIPOSOME 1.3 % IJ SUSP
20.0000 mL | Freq: Once | INTRAMUSCULAR | Status: AC
  Administered 2015-01-31: 20 mL
  Filled 2015-01-31: qty 20

## 2015-01-31 MED ORDER — SODIUM CHLORIDE 0.9 % IR SOLN
Status: AC
  Filled 2015-01-31: qty 1

## 2015-01-31 MED ORDER — LISINOPRIL 40 MG PO TABS
40.0000 mg | ORAL_TABLET | Freq: Every day | ORAL | Status: DC
  Administered 2015-01-31: 40 mg via ORAL
  Filled 2015-01-31 (×2): qty 1

## 2015-01-31 MED ORDER — FLEET ENEMA 7-19 GM/118ML RE ENEM: 1.0000 | ENEMA | Freq: Once | RECTAL | Status: AC | PRN

## 2015-01-31 MED ORDER — ATORVASTATIN CALCIUM 80 MG PO TABS
80.0000 mg | ORAL_TABLET | Freq: Every day | ORAL | Status: DC
  Administered 2015-01-31: 80 mg via ORAL
  Filled 2015-01-31 (×2): qty 1

## 2015-01-31 SURGICAL SUPPLY — 45 items
ANCHOR PEEK ZIP 5.5 NDL NO2 (Orthopedic Implant) ×3 IMPLANT
ATTRACTOMAT 16X20 MAGNETIC DRP (DRAPES) ×3 IMPLANT
BAG ZIPLOCK 12X15 (MISCELLANEOUS) ×3 IMPLANT
BLADE OSCILLATING/SAGITTAL (BLADE) ×2
BLADE SW THK.38XMED LNG THN (BLADE) ×1 IMPLANT
BNDG COHESIVE 6X5 TAN STRL LF (GAUZE/BANDAGES/DRESSINGS) ×3 IMPLANT
BUR OVAL CARBIDE 4.0 (BURR) ×3 IMPLANT
DERMASPAN .5-.9MM 4X4CM SHOU (Miscellaneous) ×3 IMPLANT
DRAPE POUCH INSTRU U-SHP 10X18 (DRAPES) ×3 IMPLANT
DRSG AQUACEL AG ADV 3.5X 6 (GAUZE/BANDAGES/DRESSINGS) ×3 IMPLANT
DURAPREP 26ML APPLICATOR (WOUND CARE) ×3 IMPLANT
ELECT BLADE TIP CTD 4 INCH (ELECTRODE) ×3 IMPLANT
ELECT REM PT RETURN 9FT ADLT (ELECTROSURGICAL) ×3
ELECTRODE REM PT RTRN 9FT ADLT (ELECTROSURGICAL) ×1 IMPLANT
GLOVE BIOGEL PI IND STRL 6.5 (GLOVE) ×1 IMPLANT
GLOVE BIOGEL PI IND STRL 8 (GLOVE) ×1 IMPLANT
GLOVE BIOGEL PI INDICATOR 6.5 (GLOVE) ×2
GLOVE BIOGEL PI INDICATOR 8 (GLOVE) ×2
GLOVE ECLIPSE 8.0 STRL XLNG CF (GLOVE) ×3 IMPLANT
GLOVE SURG SS PI 6.5 STRL IVOR (GLOVE) ×6 IMPLANT
GOWN STRL REUS W/TWL LRG LVL3 (GOWN DISPOSABLE) ×3 IMPLANT
GOWN STRL REUS W/TWL XL LVL3 (GOWN DISPOSABLE) ×3 IMPLANT
KIT BASIN OR (CUSTOM PROCEDURE TRAY) ×3 IMPLANT
KIT POSITION SHOULDER SCHLEI (MISCELLANEOUS) ×3 IMPLANT
LIQUID BAND (GAUZE/BANDAGES/DRESSINGS) ×3 IMPLANT
MANIFOLD NEPTUNE II (INSTRUMENTS) ×3 IMPLANT
NEEDLE MA TROC 1/2 (NEEDLE) IMPLANT
NS IRRIG 1000ML POUR BTL (IV SOLUTION) IMPLANT
PACK SHOULDER (CUSTOM PROCEDURE TRAY) ×3 IMPLANT
POSITIONER SURGICAL ARM (MISCELLANEOUS) ×3 IMPLANT
SLING ARM IMMOBILIZER LRG (SOFTGOODS) IMPLANT
SPONGE LAP 4X18 X RAY DECT (DISPOSABLE) IMPLANT
SPONGE SURGIFOAM ABS GEL 100 (HEMOSTASIS) ×3 IMPLANT
STAPLER VISISTAT 35W (STAPLE) IMPLANT
SUCTION FRAZIER 12FR DISP (SUCTIONS) ×3 IMPLANT
SUT BONE WAX W31G (SUTURE) ×3 IMPLANT
SUT ETHIBOND NAB CT1 #1 30IN (SUTURE) ×9 IMPLANT
SUT MNCRL AB 4-0 PS2 18 (SUTURE) ×3 IMPLANT
SUT VIC AB 0 CT1 27 (SUTURE)
SUT VIC AB 0 CT1 27XBRD ANTBC (SUTURE) IMPLANT
SUT VIC AB 1 CT1 27 (SUTURE) ×4
SUT VIC AB 1 CT1 27XBRD ANTBC (SUTURE) ×2 IMPLANT
SUT VIC AB 2-0 CT1 27 (SUTURE) ×4
SUT VIC AB 2-0 CT1 TAPERPNT 27 (SUTURE) ×2 IMPLANT
TOWEL OR 17X26 10 PK STRL BLUE (TOWEL DISPOSABLE) ×3 IMPLANT

## 2015-01-31 NOTE — Anesthesia Preprocedure Evaluation (Signed)
Anesthesia Evaluation  Patient identified by MRN, date of birth, ID band Patient awake    Reviewed: Allergy & Precautions, NPO status , Patient's Chart, lab work & pertinent test results  Airway Mallampati: II  TM Distance: >3 FB Neck ROM: Full    Dental no notable dental hx. (+) Edentulous Upper, Edentulous Lower   Pulmonary neg pulmonary ROS,  breath sounds clear to auscultation  Pulmonary exam normal       Cardiovascular hypertension, Pt. on medications Rhythm:Regular Rate:Normal     Neuro/Psych Seizures -, Well Controlled,  negative psych ROS   GI/Hepatic negative GI ROS, Neg liver ROS,   Endo/Other  negative endocrine ROS  Renal/GU negative Renal ROS  negative genitourinary   Musculoskeletal negative musculoskeletal ROS (+)   Abdominal   Peds negative pediatric ROS (+)  Hematology negative hematology ROS (+)   Anesthesia Other Findings   Reproductive/Obstetrics negative OB ROS                             Anesthesia Physical Anesthesia Plan  ASA: II  Anesthesia Plan: General   Post-op Pain Management:    Induction: Intravenous  Airway Management Planned: Oral ETT  Additional Equipment:   Intra-op Plan:   Post-operative Plan: Extubation in OR  Informed Consent: I have reviewed the patients History and Physical, chart, labs and discussed the procedure including the risks, benefits and alternatives for the proposed anesthesia with the patient or authorized representative who has indicated his/her understanding and acceptance.   Dental advisory given  Plan Discussed with: CRNA  Anesthesia Plan Comments:         Anesthesia Quick Evaluation

## 2015-01-31 NOTE — Interval H&P Note (Signed)
History and Physical Interval Note:  01/31/2015 8:16 AM  [name] Aguilar  has presented today for surgery, with the diagnosis of OPEN LEFT SHOULDER ROTATOR CUFF TEAR  The various methods of treatment have been discussed with the patient and family. After consideration of risks, benefits and other options for treatment, the patient has consented to  Procedure(s): LEFT ROTATOR CUFF REPAIR SHOULDER OPEN (Left) as a surgical intervention .  The patient's history has been reviewed, patient examined, no change in status, stable for surgery.  I have reviewed the patient's chart and labs.  Questions were answered to the patient's satisfaction.     Xenia Nile A

## 2015-01-31 NOTE — Transfer of Care (Signed)
Immediate Anesthesia Transfer of Care Note  Patient: [name] Aguilar  Procedure(s) Performed: Procedure(s): LEFT ROTATOR CUFF REPAIR SHOULDER OPEN WITH ACROMIONECTOMY AND GRAFT AND ANCHOR (Left)  Patient Location: PACU  Anesthesia Type:General  Level of Consciousness: awake, alert  and oriented  Airway & Oxygen Therapy: Patient Spontanous Breathing and Patient connected to face mask oxygen  Post-op Assessment: Report given to RN  Post vital signs: Reviewed and stable  Last Vitals:  Filed Vitals:   01/31/15 0637  BP: 138/84  Pulse: 106  Temp: 36.6 C  Resp: 16    Complications: No apparent anesthesia complications

## 2015-01-31 NOTE — Anesthesia Procedure Notes (Signed)
Procedure Name: Intubation Date/Time: 01/31/2015 8:26 AM Performed by: Searcy Miyoshi, Nuala AlphaKRISTOPHER Pre-anesthesia Checklist: Patient identified, Emergency Drugs available, Suction available, Patient being monitored and Timeout performed Patient Re-evaluated:Patient Re-evaluated prior to inductionOxygen Delivery Method: Circle system utilized Preoxygenation: Pre-oxygenation with 100% oxygen Intubation Type: IV induction Ventilation: Mask ventilation without difficulty Laryngoscope Size: Mac and 4 Grade View: Grade I Tube type: Oral Tube size: 7.5 mm Number of attempts: 1 Airway Equipment and Method: Stylet Placement Confirmation: ETT inserted through vocal cords under direct vision,  positive ETCO2,  CO2 detector and breath sounds checked- equal and bilateral Secured at: 21 cm Tube secured with: Tape Dental Injury: Teeth and Oropharynx as per pre-operative assessment

## 2015-01-31 NOTE — H&P (Signed)
[name] Aguilar is an 64 y.o. female.   Chief Complaint: Unable to elevate her Left Shoulder. HPI: Larey SeatFell at a place of business and injured her shoulder.  Past Medical History  Diagnosis Date  . Hypertension   . GERD (gastroesophageal reflux disease)   . Rotator cuff tear     BILATERAL - SEVERE PAIN BOTH SHOULDERS  . Hypercholesteremia   . Hypothyroidism   . Seizures     YEARS AGO - NONE NOW  . Headache(784.0)     hx of a long time ago, none recent  . Arthritis   . Anemia     hx of  . Hepatitis     STATES YELLOW JAUNDICE YRS AGO    Past Surgical History  Procedure Laterality Date  . Shoulder open rotator cuff repair Right 11/09/2013    Procedure: RIGHT ROTATOR CUFF REPAIR SHOULDER OPEN/CLOSED MANIPULATION/ OPEN ACROMINECTOMY,;  Surgeon: Jacki Conesonald A Keelan Pomerleau, MD;  Location: [facility];  Service: Orthopedics;  Laterality: Right;  . Knee arthroscopy Right   . Abdominal hysterectomy    . Total knee arthroplasty Left 02/15/2014    Procedure: LEFT TOTAL KNEE ARTHROPLASTY;  Surgeon: Jacki Conesonald A Brittani Purdum, MD;  Location: [facility];  Service: Orthopedics;  Laterality: Left;    History reviewed. No pertinent family history. Social History:  reports that she has never smoked. Her smokeless tobacco use includes Snuff. She reports that she drinks alcohol. She reports that she does not use illicit drugs.  Allergies: No Known Allergies  Medications Prior to Admission  Medication Sig Dispense Refill  . amLODipine (NORVASC) 5 MG tablet Take 5 mg by mouth daily with breakfast.    . aspirin EC 81 MG tablet Take 81 mg by mouth daily.    Marland Kitchen. atorvastatin (LIPITOR) 80 MG tablet Take 80 mg by mouth daily.    . diclofenac (VOLTAREN) 75 MG EC tablet Take 75 mg by mouth 2 (two) times daily.    Marland Kitchen. docusate sodium 100 MG CAPS Take 100 mg by mouth 2 (two) times daily. 10 capsule 0  . levothyroxine (SYNTHROID, LEVOTHROID) 75 MCG tablet Take 75 mcg by mouth daily before breakfast.    . lisinopril (PRINIVIL,ZESTRIL) 40 MG  tablet Take 40 mg by mouth every morning.    . potassium chloride SA (K-DUR,KLOR-CON) 20 MEQ tablet Take 20 mEq by mouth daily.    Marland Kitchen. doxycycline (VIBRAMYCIN) 100 MG capsule Take 1 capsule (100 mg total) by mouth 2 (two) times daily. (Patient not taking: Reported on 01/16/2015) 20 capsule 0  . ferrous sulfate 325 (65 FE) MG tablet Take 1 tablet (325 mg total) by mouth 3 (three) times daily after meals. (Patient not taking: Reported on 01/16/2015) 30 tablet 0  . gabapentin (NEURONTIN) 300 MG capsule Take 300 mg by mouth 2 (two) times daily.     Marland Kitchen. oxyCODONE (OXY IR/ROXICODONE) 5 MG immediate release tablet Take 1-3 tablets (5-15 mg total) by mouth every 4 (four) hours as needed for severe pain. (Patient not taking: Reported on 01/16/2015) 90 tablet 0    No results found for this or any previous visit (from the past 48 hour(s)). No results found.  Review of Systems  Constitutional: Negative.   HENT: Negative.   Eyes: Negative.   Respiratory: Negative.   Cardiovascular: Negative.   Gastrointestinal: Negative.   Genitourinary: Negative.   Musculoskeletal: Positive for joint pain.  Skin: Negative.   Neurological: Negative.   Endo/Heme/Allergies: Negative.   Psychiatric/Behavioral: Negative.     Blood pressure 138/84, pulse 106,  temperature 97.9 F (36.6 C), temperature source Oral, resp. rate 16, height  (1.6 m), weight 74.844 kg (165 lb), SpO2 100 %. Physical Exam  Constitutional: She appears well-developed.  HENT:  Head: Normocephalic.  Eyes: Pupils are equal, round, and reactive to light.  Neck: Normal range of motion.  Cardiovascular: Normal rate.   Respiratory: Effort normal.  GI: Soft.  Musculoskeletal:  Pain and limited motion of Left shoulder.  Neurological: She is alert.  Skin: Skin is warm.     Assessment/Plan Open Repair of her left Rotator Cuff.  Antonious Omahoney A 01/31/2015, 8:13 AM

## 2015-01-31 NOTE — Brief Op Note (Signed)
01/31/2015  9:35 AM  PATIENT:  [name] Aguilar  64 y.o. female  PRE-OPERATIVE DIAGNOSIS:Complex,Retracted  ROTATOR CUFF TEAR on the Left and Severe Impingement.  POST-OPERATIVE DIAGNOSIS:  COMPLEX RETRACTED TEAR of Left ROTATOR CUFF with severe Impingement.,COMPLEX  PROCEDURE:  Procedure(s): LEFT ROTATOR CUFF REPAIR SHOULDER OPEN WITH ACROMIONECTOMY AND GRAFT AND ANCHOR (Left)  SURGEON:  Surgeon(s) and Role:    * Jacki Conesonald A Matheson Vandehei, MD - Primary  PHYSICIAN ASSISTANT:Amber Mesquiteonstable PA   ASSISTANTS:Amber [place]onstable PA   ANESTHESIA:   general  EBL:  Total I/O In: 1000 [I.V.:1000] Out: 50 [Blood:50]  BLOOD ADMINISTERED:none  DRAINS: none   LOCAL MEDICATIONS USED:  BUPIVICAINE 20cc.  SPECIMEN:  No Specimen  DISPOSITION OF SPECIMEN:  N/A  COUNTS:  YES  TOURNIQUET:  * No tourniquets in log *  DICTATION: .Other Dictation: Dictation Number [id number]  PLAN OF CARE: Admit for overnight observation  PATIENT DISPOSITION:  Stable in OR   Delay start of Pharmacological VTE agent (>24hrs) due to surgical blood loss or risk of bleeding: yes

## 2015-02-01 ENCOUNTER — Encounter (HOSPITAL_COMMUNITY): Payer: Self-pay | Admitting: Orthopedic Surgery

## 2015-02-01 DIAGNOSIS — M75122 Complete rotator cuff tear or rupture of left shoulder, not specified as traumatic: Secondary | ICD-10-CM | POA: Diagnosis not present

## 2015-02-01 MED ORDER — METHOCARBAMOL 500 MG PO TABS: 500.0000 mg | ORAL_TABLET | Freq: Four times a day (QID) | ORAL | Status: DC | PRN

## 2015-02-01 MED ORDER — OXYCODONE HCL 5 MG PO TABS: 5.0000 mg | ORAL_TABLET | ORAL | Status: AC | PRN

## 2015-02-01 NOTE — Evaluation (Addendum)
Occupational Therapy Evaluation Patient Details Name: [name] Aguilar MRN: [id number] DOB: Jan 23, 1951 Today's Date: 02/01/2015    History of Present Illness Pt with L rotator cuff repair   Clinical Impression   Pt to d/c today. Instructed pt on all shoulder care with ADL. Issued handout and reviewed information also. Pt familiar with ADL techniques from a shoulder surgery in the past. Pt ok to d/c from OT standpoint. Spouse will be assisting but she also plans to have a HH aide for bathing/dressing.    Follow Up Recommendations  No OT follow up;Supervision/Assistance - 24 hour;Other (comment) (pt states she is obtaining an aide at d/c to help with B/D) Progress rehab of shoulder as MD orders at followup.   Equipment Recommendations  None recommended by OT    Recommendations for Other Services       Precautions / Restrictions Precautions Precautions: Shoulder Shoulder Interventions: Shoulder sling/immobilizer;Off for dressing/bathing/exercises Restrictions Weight Bearing Restrictions: Yes LUE Weight Bearing: Non weight bearing      Mobility Bed Mobility                  Transfers                      Balance                                            ADL                                         General ADL Comments: Spouse present and assisted with donning/doffing shoulder immobilzer, donning shirt and other clothing. Educated pt and spouse on how to perform ADL with shoulder precautions. Pt states they are hiring a HH aide to assist with bathing/dressing 7 days a week. Educated spouse and had him practice hands on in case he does still have to assist.  Pt's spouse did need some cues and verbal instruction but pt is familiar with shoulder precautions and ADL from having shoulder surgery in the past so she is able to direct caregiver how to assist.      Vision     Perception     Praxis      Pertinent Vitals/Pain  Pain Assessment: 0-10 Pain Score: 9  Pain Location: L shoulder Pain Descriptors / Indicators: Aching Pain Intervention(s): Patient requesting pain meds-RN notified;Repositioned     Hand Dominance Right   Extremity/Trunk Assessment             Communication Communication Communication: No difficulties   Cognition Arousal/Alertness: Awake/alert Behavior During Therapy: WFL for tasks assessed/performed Overall Cognitive Status: Within Functional Limits for tasks assessed                     General Comments       Exercises       Shoulder Instructions Shoulder Instructions Donning/doffing shirt without moving shoulder: Patient able to independently direct caregiver;Caregiver independent with task Method for sponge bathing under operated UE: Patient able to independently direct caregiver;Caregiver independent with task Donning/doffing sling/immobilizer: Patient able to independently direct caregiver Correct positioning of sling/immobilizer: Patient able to independently direct caregiver ROM for elbow, wrist and digits of operated UE: Supervision/safety Sling wearing schedule (on at all times/off for ADL's): Independent Proper  positioning of operated UE when showering: Independent Positioning of UE while sleeping: Independent    Home Living Family/patient expects to be discharged to:: Private residence Living Arrangements: Spouse/significant other;Children Available Help at Discharge: Family;Personal care attendant Type of Home: House Home Access: Stairs to enter Secretary/administratorntrance Stairs-Number of Steps: 3   Home Layout: One level               Home Equipment: Bedside commode          Prior Functioning/Environment Level of Independence: Independent with assistive device(s)        Comments: uses cane to ambulate    OT Diagnosis: Generalized weakness   OT Problem List:     OT Treatment/Interventions:      OT Goals(Current goals can be found in the  care plan section) Acute Rehab OT Goals Patient Stated Goal: home OT Goal Formulation: With patient  OT Frequency:     Barriers to D/C:            Co-evaluation              End of Session    Activity Tolerance: Patient limited by pain Patient left: in bed;with call bell/phone within reach;with family/visitor present   Time: [id number] OT Time Calculation (min): 32 min Charges:  OT General Charges $OT Visit: 1 Procedure OT Evaluation $Initial OT Evaluation Tier I: 1 Procedure OT Treatments $Self Care/Home Management : 8-22 mins G-Codes: OT G-codes **NOT FOR INPATIENT CLASS** Functional Assessment Tool Used: clinical judgement Functional Limitation: Self care Self Care Current Status (V4098(G8987): At least 20 percent but less than 40 percent impaired, limited or restricted Self Care Goal Status (J1914(G8988): At least 20 percent but less than 40 percent impaired, limited or restricted Self Care Discharge Status [phone]): At least 20 percent but less than 40 percent impaired, limited or restricted  [name], [name]  [phone] 02/01/2015, 1:31 PM

## 2015-02-01 NOTE — Progress Notes (Signed)
Subjective: 1 Day Post-Op Procedure(s) (LRB): LEFT ROTATOR CUFF REPAIR SHOULDER OPEN WITH ACROMIONECTOMY AND GRAFT AND ANCHOR (Left) Patient reports pain as 2 on 0-10 scale.Doing very well today. Will DC.    Objective: Vital signs in last 24 hours: Temp:  [97.3 F (36.3 C)-98.7 F (37.1 C)] 98.1 F (36.7 C) (02/25 0519) Pulse Rate:  [72-98] 84 (02/25 0519) Resp:  [10-20] 18 (02/25 0519) BP: (119-185)/(65-102) 139/85 mmHg (02/25 0519) SpO2:  [93 %-100 %] 94 % (02/25 0519) Weight:  [74.844 kg (165 lb)] 74.844 kg (165 lb) (02/24 1145)  Intake/Output from previous day: 02/24 0701 - 02/25 0700 In: 3290 [P.O.:840; I.V.:2350; IV Piggyback:100] Out: 1650 [Urine:1600; Blood:50] Intake/Output this shift:    No results for input(s): HGB in the last 72 hours. No results for input(s): WBC, RBC, HCT, PLT in the last 72 hours. No results for input(s): NA, K, CL, CO2, BUN, CREATININE, GLUCOSE, CALCIUM in the last 72 hours. No results for input(s): LABPT, INR in the last 72 hours.  Neurovascular intact  Assessment/Plan: 1 Day Post-Op Procedure(s) (LRB): LEFT ROTATOR CUFF REPAIR SHOULDER OPEN WITH ACROMIONECTOMY AND GRAFT AND ANCHOR (Left) Discharge home with home health after seen by OT  Tyrhonda Georgiades A 02/01/2015, 7:23 AM

## 2015-02-01 NOTE — Op Note (Signed)
NAMJacelyn Aguilar:  Neuenfeldt, [name] Aguilar              ACCOUNT NO.:  [id number]  MEDICAL RECORD NO.:  [id number]  LOCATION:  1613                         [facility]  PHYSICIAN:  Georges Lynchonald A. Heavan Francom, M.D.DATE OF BIRTH:  Aug 19, 1951  DATE OF PROCEDURE:  01/31/2015 DATE OF DISCHARGE:                              OPERATIVE REPORT   SURGEON:  Georges Lynchonald A. Darrelyn HillockGioffre, M.D.  ASSISTANT:  Dimitri PedAmber Constable, [place].  PREOPERATIVE DIAGNOSIS:  Atraumatic complete complex rotator cuff tendon tear of the left shoulder secondary to fall in the place of business, this happened a few years ago.  POSTOPERATIVE DIAGNOSIS:  Atraumatic complete complex rotator cuff tendon tear of the left shoulder secondary to fall in the place of business, this happened a few years ago.  OPERATION: 1. Open acromionectomy and acromioplasty, left shoulder. 2. Repair of a complete complex retracted tear of the left rotator     cuff tendon utilizing a DermaSpan graft with 1 anchor.  DESCRIPTION OF PROCEDURE:  Under general anesthesia, routine orthopedic prepping and draping of the left shoulder was carried out.  The appropriate time-out was carried out.  I also marked the appropriate left shoulder in the holding area.  She was given 1 g of IV Ancef.  At this time, the incision was made over the anterior aspect of the left shoulder.  Bleeders were identified and cauterized.  I then went down to the acromion and self-retaining retractors were inserted.  I detached the deltoid tendon from the acromion and split the proximal part of the deltoid muscle.  At this time, it was noted that the acromion was extremely thickened and down slope.  I protected the underlying cuff that was remaining with the Bennett retractor and utilized an oscillating saw.  Did an acromionectomy and partial acromioplasty. Then, I thoroughly irrigated the area out.  Bone waxed the undersurface of the acromion.  I then directed attention down to the cuff.  The rotator cuff  was completely retracted and torn from the humerus insertion and it was retracted medially.  We identified the long head of the biceps.  We identified the lateral portion of the cuff which was degenerated.  We then burred the lateral articular surface of the humerus.  Then we first brought the cuff from medial to the biceps tendon and sutured that in place and brought the remaining cuff that was lateral and sutured that in place and then utilized the DermaSpan graft with 1 anchor to reinforce the site.  We thoroughly irrigated out the area.  I then reattached the deltoid tendon muscle in usual fashion. Remaining part of the wound was closed in usual fashion.  Prior to closing the wound, we injected 20 mL of 0.25% Marcaine with epinephrine into the shoulder.  After sterile dressing was applied, she was placed in a shoulder immobilizer.  SURGEON:  Georges Lynchonald A. Darrelyn HillockGioffre, M.D.  ASSISTANT:  Dimitri PedAmber Constable, PA.          ______________________________ Georges Lynchonald A. Darrelyn HillockGioffre, M.D.     RAG/MEDQ  D:  01/31/2015  T:  02/01/2015  Job:  [id number]

## 2015-02-01 NOTE — Progress Notes (Signed)
UR completed 

## 2015-02-01 NOTE — Discharge Instructions (Signed)
Keep your sling on at all times, including sleeping in your sling. The only time you should remove your sling is to shower only but you need to keep your hand against your chest while you shower.  You can shower with the dressing on. Do not remove the dressing unless it appears compromised. Resume regular dosage of potassium once your return home Call Dr. Darrelyn HillockGioffre if any wound complications or temperature of 101 degrees F or over.  Call the office for an appointment to see Dr. Darrelyn HillockGioffre in two weeks: [phone] and ask for Dr. Jeannetta EllisGioffre's nurse, Mackey Birchwoodammy Johnson.

## 2015-02-05 NOTE — Discharge Summary (Signed)
Physician Discharge Summary   Patient ID: [name] Aguilar MRN: [id number] DOB/AGE: 02/13/1951 64 y.o.  Admit date: 01/31/2015 Discharge date: 02/01/2015  Primary Diagnosis: Rotator cuff tear, left shoulder   Admission Diagnoses:  Past Medical History  Diagnosis Date  . Hypertension   . GERD (gastroesophageal reflux disease)   . Rotator cuff tear     BILATERAL - SEVERE PAIN BOTH SHOULDERS  . Hypercholesteremia   . Hypothyroidism   . Seizures     YEARS AGO - NONE NOW  . Headache(784.0)     hx of a long time ago, none recent  . Arthritis   . Anemia     hx of  . Hepatitis     STATES YELLOW JAUNDICE YRS AGO   Discharge Diagnoses:   Active Problems:   Rotator cuff tear  Estimated body mass index is 29.24 kg/(m^2) as calculated from the following:   Height as of this encounter: $RemoveBeforeD'5\' 3"'[id number]$  (1.6 m).   Weight as of this encounter: 74.844 kg (165 lb).  Procedure:  Procedure(s) (LRB): LEFT ROTATOR CUFF REPAIR SHOULDER OPEN WITH ACROMIONECTOMY AND GRAFT AND ANCHOR (Left)   Consults: None  HPI: [place] at a place of business and injured her shoulder.  Laboratory Data: Hospital Outpatient Visit on 01/18/2015  Component Date Value Ref Range Status  . aPTT 01/18/2015 32  24 - 37 seconds Final  . WBC 01/18/2015 9.9  4.0 - 10.5 K/uL Final  . RBC 01/18/2015 4.23  3.87 - 5.11 MIL/uL Final  . Hemoglobin 01/18/2015 14.1  12.0 - 15.0 g/dL Final  . HCT 01/18/2015 41.3  36.0 - 46.0 % Final  . MCV 01/18/2015 97.6  78.0 - 100.0 fL Final  . MCH 01/18/2015 33.3  26.0 - 34.0 pg Final  . MCHC 01/18/2015 34.1  30.0 - 36.0 g/dL Final  . RDW 01/18/2015 13.2  11.5 - 15.5 % Final  . Platelets 01/18/2015 278  150 - 400 K/uL Final  . Neutrophils Relative % 01/18/2015 77  43 - 77 % Final  . Neutro Abs 01/18/2015 7.6  1.7 - 7.7 K/uL Final  . Lymphocytes Relative 01/18/2015 14  12 - 46 % Final  . Lymphs Abs 01/18/2015 1.4  0.7 - 4.0 K/uL Final  . Monocytes Relative 01/18/2015 9  3 - 12 % Final  .  Monocytes Absolute 01/18/2015 0.9  0.1 - 1.0 K/uL Final  . Eosinophils Relative 01/18/2015 0  0 - 5 % Final  . Eosinophils Absolute 01/18/2015 0.0  0.0 - 0.7 K/uL Final  . Basophils Relative 01/18/2015 0  0 - 1 % Final  . Basophils Absolute 01/18/2015 0.0  0.0 - 0.1 K/uL Final  . Sodium 01/18/2015 139  135 - 145 mmol/L Final  . Potassium 01/18/2015 2.8* 3.5 - 5.1 mmol/L Final  . Chloride 01/18/2015 99  96 - 112 mmol/L Final  . CO2 01/18/2015 30  19 - 32 mmol/L Final  . Glucose, Bld 01/18/2015 107* 70 - 99 mg/dL Final  . BUN 01/18/2015 8  6 - 23 mg/dL Final  . Creatinine, Ser 01/18/2015 0.74  0.50 - 1.10 mg/dL Final  . Calcium 01/18/2015 9.8  8.4 - 10.5 mg/dL Final  . Total Protein 01/18/2015 8.5* 6.0 - 8.3 g/dL Final  . Albumin 01/18/2015 3.9  3.5 - 5.2 g/dL Final  . AST 01/18/2015 23  0 - 37 U/L Final  . ALT 01/18/2015 13  0 - 35 U/L Final  . Alkaline Phosphatase 01/18/2015 86  39 - 117 U/L Final  .  Total Bilirubin 01/18/2015 0.8  0.3 - 1.2 mg/dL Final  . GFR calc non Af Amer 01/18/2015 88* >90 mL/min Final  . GFR calc Af Amer 01/18/2015 >90  >90 mL/min Final   Comment: (NOTE) The eGFR has been calculated using the CKD EPI equation. This calculation has not been validated in all clinical situations. eGFR's persistently <90 mL/min signify possible Chronic Kidney Disease.   . Anion gap 01/18/2015 10  5 - 15 Final  . Prothrombin Time 01/18/2015 12.9  11.6 - 15.2 seconds Final  . INR 01/18/2015 0.96  0.00 - 1.49 Final  . Color, Urine 01/18/2015 [name] Aguilar* YELLOW Final   BIOCHEMICALS MAY BE AFFECTED BY COLOR  . APPearance 01/18/2015 CLOUDY* CLEAR Final  . Specific Gravity, Urine 01/18/2015 1.024  1.005 - 1.030 Final  . pH 01/18/2015 5.5  5.0 - 8.0 Final  . Glucose, UA 01/18/2015 NEGATIVE  NEGATIVE mg/dL Final  . Hgb urine dipstick 01/18/2015 MODERATE* NEGATIVE Final  . Bilirubin Urine 01/18/2015 SMALL* NEGATIVE Final  . Ketones, ur 01/18/2015 NEGATIVE  NEGATIVE mg/dL Final  . Protein,  ur 01/18/2015 NEGATIVE  NEGATIVE mg/dL Final  . Urobilinogen, UA 01/18/2015 0.2  0.0 - 1.0 mg/dL Final  . Nitrite 01/18/2015 NEGATIVE  NEGATIVE Final  . Leukocytes, UA 01/18/2015 SMALL* NEGATIVE Final  . Squamous Epithelial / LPF 01/18/2015 FEW* RARE Final  . WBC, UA 01/18/2015 0-2  <3 WBC/hpf Final  . RBC / HPF 01/18/2015 0-2  <3 RBC/hpf Final  . Bacteria, UA 01/18/2015 FEW* RARE Final  . Urine-Other 01/18/2015 MUCOUS PRESENT   Final     X-Rays:Dg Chest 2 View  01/18/2015   CLINICAL DATA:  Hypertension.  EXAM: CHEST  2 VIEW  COMPARISON:  None.  FINDINGS: Mediastinum and hilar structures are normal. Lungs clear of acute infiltrates. Heart size normal. No pleural effusion or pneumothorax. Heart size normal. No acute bony abnormality.  IMPRESSION: No acute cardiopulmonary disease.   Electronically Signed   By: Marcello Moores  Register   On: 01/18/2015 16:05    EKG: Orders placed or performed during the hospital encounter of 01/18/15  . EKG  . EKG  . EKG 12-Lead  . EKG 12-Lead     Hospital Course: [name] Aguilar is a 64 y.o. who was admitted to [facility]. They were brought to the operating room on 01/31/2015 and underwent Procedure(s): Oasis WITH ACROMIONECTOMY AND GRAFT AND ANCHOR.  Patient tolerated the procedure well and was later transferred to the recovery room and then to the orthopaedic floor for postoperative care.  They were given PO and IV analgesics for pain control following their surgery.  They were given 24 hours of postoperative antibiotics of  Anti-infectives    Start     Dose/Rate Route Frequency Ordered Stop   01/31/15 1400  ceFAZolin (ANCEF) IVPB 1 g/50 mL premix     1 g 100 mL/hr over 30 Minutes Intravenous Every 6 hours 01/31/15 1157 02/01/15 0144   01/31/15 0854  polymyxin B 500,000 Units, bacitracin 50,000 Units in sodium chloride irrigation 0.9 % 500 mL irrigation  Status:  Discontinued       As needed 01/31/15 0854 01/31/15 0951     01/31/15 0640  ceFAZolin (ANCEF) IVPB 2 g/50 mL premix     2 g 100 mL/hr over 30 Minutes Intravenous On call to O.R. 01/31/15 2094 01/31/15 0830     and started on DVT prophylaxis in the form of Aspirin.    OT was ordered.  Discharge planning consulted to help with postop disposition and equipment needs.  Patient had a good night on the evening of surgery.  They started to get up OOB with therapy on day one.   Patient was seen in rounds and was ready to go home.   Diet: Cardiac diet Activity:Wear sling at all times except showering Follow-up:in 2 weeks Disposition - Home Discharged Condition: stable   Discharge Instructions    Call MD / Call 911    Complete by:  As directed   If you experience chest pain or shortness of breath, CALL 911 and be transported to the hospital emergency room.  If you develope a fever above 101 F, pus (white drainage) or increased drainage or redness at the wound, or calf pain, call your surgeon's office.     Constipation Prevention    Complete by:  As directed   Drink plenty of fluids.  Prune juice may be helpful.  You may use a stool softener, such as Colace (over the counter) 100 mg twice a day.  Use MiraLax (over the counter) for constipation as needed.     Diet - low sodium heart healthy    Complete by:  As directed      Discharge instructions    Complete by:  As directed   Keep your sling on at all times, including sleeping in your sling. The only time you should remove your sling is to shower only but you need to keep your hand against your chest while you shower.  You can shower with the dressing on. Do not remove the dressing unless it appears compromised. Resume regular dosage of potassium one your return home Call Dr. Gladstone Lighter if any wound complications or temperature of 101 degrees F or over.  Call the office for an appointment to see Dr. Gladstone Lighter in two weeks: [phone] and ask for Dr. Charlestine Night nurse, Brunilda Payor.     Driving restrictions     Complete by:  As directed   No driving     Increase activity slowly as tolerated    Complete by:  As directed      Lifting restrictions    Complete by:  As directed   No lifting            Medication List    STOP taking these medications        doxycycline 100 MG capsule  Commonly known as:  VIBRAMYCIN     ferrous sulfate 325 (65 FE) MG tablet      TAKE these medications        amLODipine 5 MG tablet  Commonly known as:  NORVASC  Take 5 mg by mouth daily with breakfast.     aspirin EC 81 MG tablet  Take 81 mg by mouth daily.     atorvastatin 80 MG tablet  Commonly known as:  LIPITOR  Take 80 mg by mouth daily.     diclofenac 75 MG EC tablet  Commonly known as:  VOLTAREN  Take 75 mg by mouth 2 (two) times daily.     DSS 100 MG Caps  Take 100 mg by mouth 2 (two) times daily.     gabapentin 300 MG capsule  Commonly known as:  NEURONTIN  Take 300 mg by mouth 2 (two) times daily.     levothyroxine 75 MCG tablet  Commonly known as:  SYNTHROID, LEVOTHROID  Take 75 mcg by mouth daily before breakfast.     lisinopril 40 MG tablet  Commonly known as:  PRINIVIL,ZESTRIL  Take 40 mg by mouth every morning.     methocarbamol 500 MG tablet  Commonly known as:  ROBAXIN  Take 1 tablet (500 mg total) by mouth every 6 (six) hours as needed for muscle spasms.     oxyCODONE 5 MG immediate release tablet  Commonly known as:  Oxy IR/ROXICODONE  Take 1-3 tablets (5-15 mg total) by mouth every 4 (four) hours as needed for severe pain.     potassium chloride SA 20 MEQ tablet  Commonly known as:  K-DUR,KLOR-CON  Take 20 mEq by mouth daily.           Follow-up Information    Follow up with GIOFFRE,RONALD A, MD. Schedule an appointment as soon as possible for a visit in 2 weeks.   Specialty:  Orthopedic Surgery   Contact information:   [address] [place] [address] [phone]       Signed: Ardeen Jourdain, PA-C Orthopaedic  Surgery 02/05/2015, 9:11 AM

## 2015-02-09 NOTE — Anesthesia Postprocedure Evaluation (Signed)
  Anesthesia Post-op Note  Patient: [name] Aguilar  Procedure(s) Performed: Procedure(s) (LRB): LEFT ROTATOR CUFF REPAIR SHOULDER OPEN WITH ACROMIONECTOMY AND GRAFT AND ANCHOR (Left)  Patient Location: PACU  Anesthesia Type: General  Level of Consciousness: awake and alert   Airway and Oxygen Therapy: Patient Spontanous Breathing  Post-op Pain: mild  Post-op Assessment: Post-op Vital signs reviewed, Patient's Cardiovascular Status Stable, Respiratory Function Stable, Patent Airway and No signs of Nausea or vomiting  Last Vitals:  Filed Vitals:   02/01/15 0519  BP: 139/85  Pulse: 84  Temp: 36.7 C  Resp: 18    Post-op Vital Signs: stable   Complications: No apparent anesthesia complications

## 2015-08-16 IMAGING — CR DG CHEST 2V
2 series · 2 of 2 positions shown · non-contrast
Comparison: None.

CLINICAL DATA: Hypertension.

EXAM:
CHEST  2 VIEW

[w chest pa]
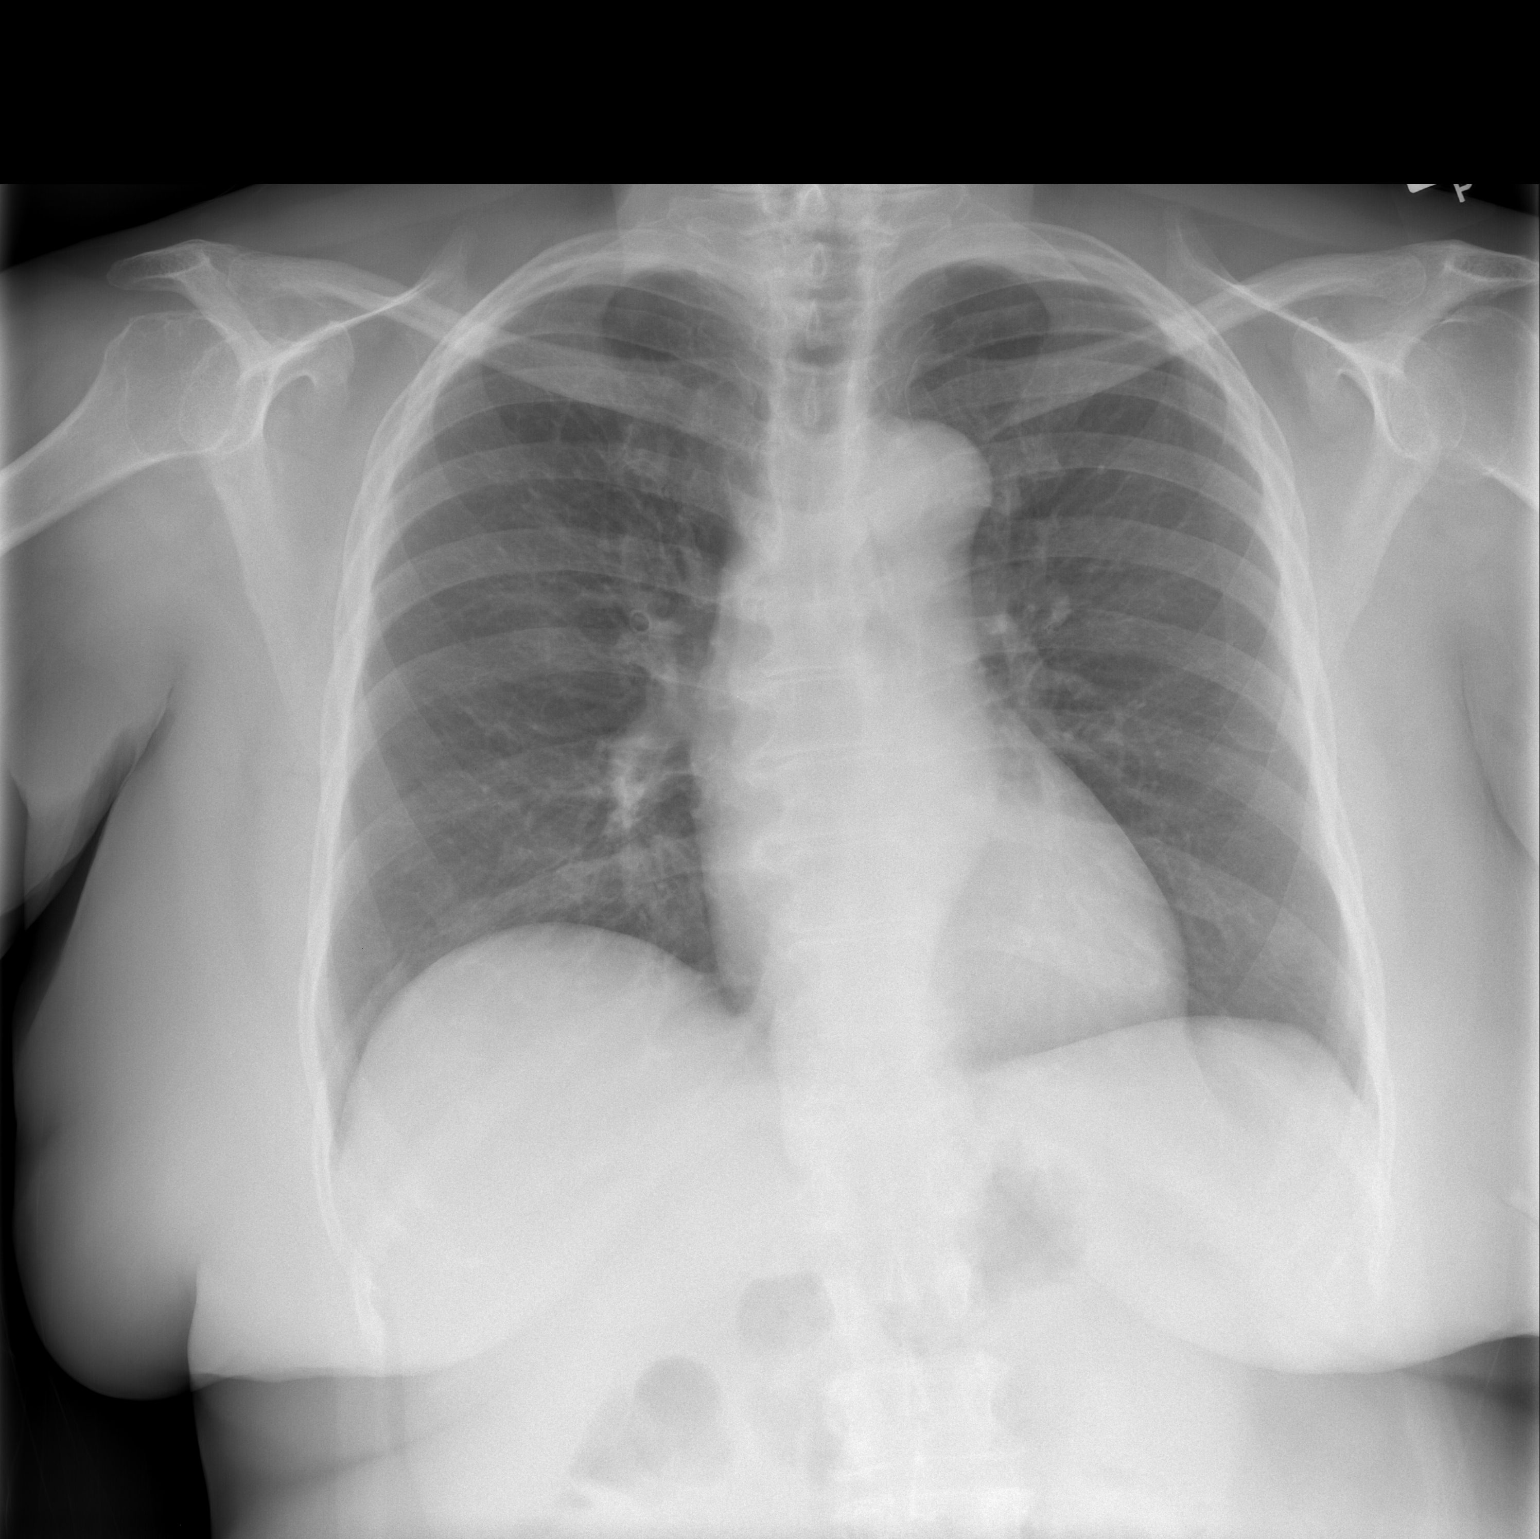

[w chest lat]
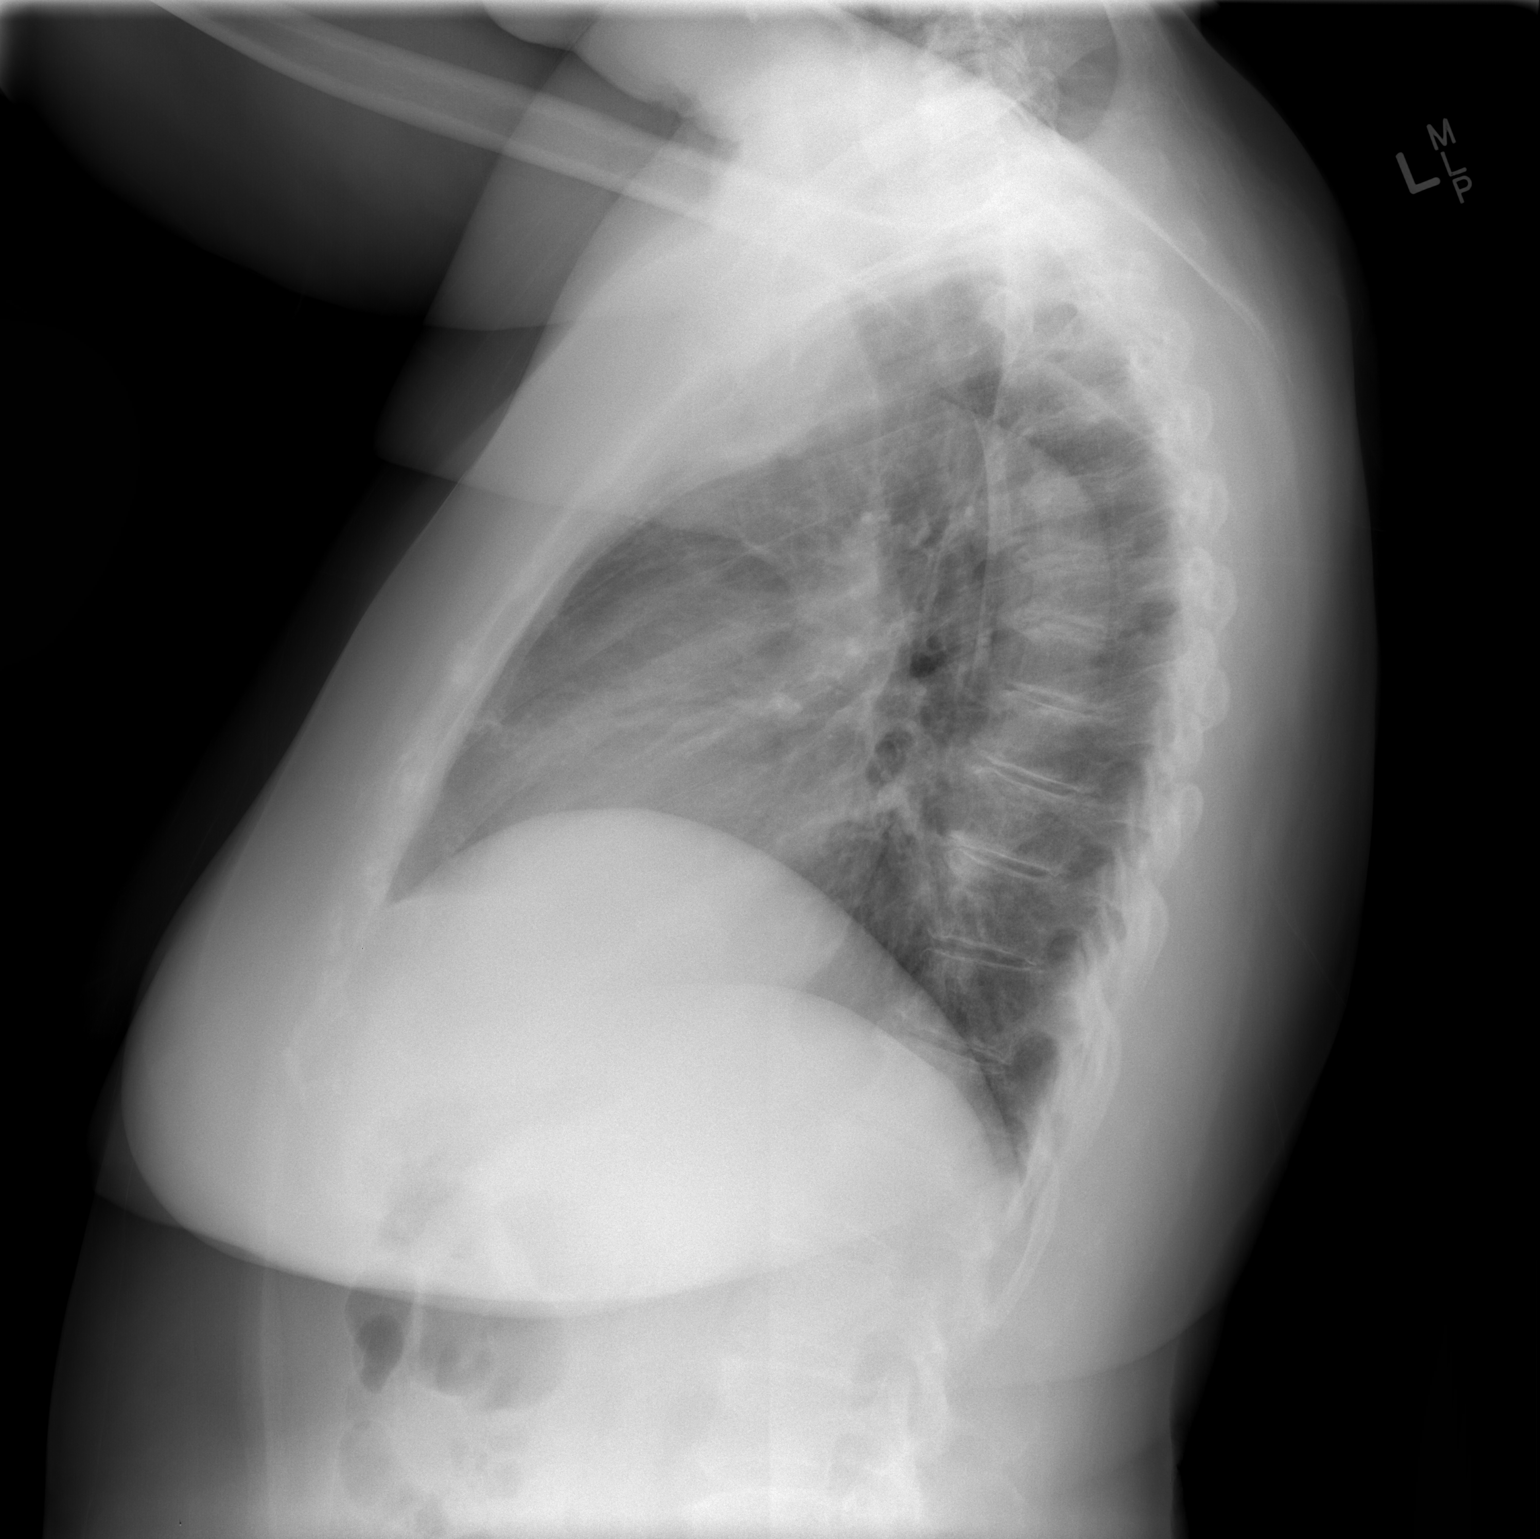

[2 of 2 positions shown; findings below may reference images not displayed]

FINDINGS: Mediastinum and hilar structures are normal. Lungs clear of acute
infiltrates. Heart size normal. No pleural effusion or pneumothorax.
Heart size normal. No acute bony abnormality.
IMPRESSION: No acute cardiopulmonary disease.

## 2023-07-11 ENCOUNTER — Other Ambulatory Visit: Payer: Self-pay

## 2023-07-11 ENCOUNTER — Emergency Department (HOSPITAL_BASED_OUTPATIENT_CLINIC_OR_DEPARTMENT_OTHER): Payer: Medicare HMO

## 2023-07-11 ENCOUNTER — Inpatient Hospital Stay (HOSPITAL_BASED_OUTPATIENT_CLINIC_OR_DEPARTMENT_OTHER)
Admission: EM | Admit: 2023-07-11 | Discharge: 2023-07-15 | DRG: 872 | Disposition: A | Discharge status: Home / Self Care | Payer: Medicare HMO | Attending: Internal Medicine | Admitting: Internal Medicine

## 2023-07-11 ENCOUNTER — Encounter (HOSPITAL_BASED_OUTPATIENT_CLINIC_OR_DEPARTMENT_OTHER): Payer: Self-pay

## 2023-07-11 DIAGNOSIS — K922 Gastrointestinal hemorrhage, unspecified: Secondary | ICD-10-CM

## 2023-07-11 DIAGNOSIS — E785 Hyperlipidemia, unspecified: Secondary | ICD-10-CM | POA: Insufficient documentation

## 2023-07-11 DIAGNOSIS — A419 Sepsis, unspecified organism: Principal | ICD-10-CM | POA: Diagnosis present

## 2023-07-11 DIAGNOSIS — K625 Hemorrhage of anus and rectum: Secondary | ICD-10-CM

## 2023-07-11 DIAGNOSIS — Z79899 Other long term (current) drug therapy: Secondary | ICD-10-CM

## 2023-07-11 DIAGNOSIS — M1990 Unspecified osteoarthritis, unspecified site: Secondary | ICD-10-CM | POA: Diagnosis present

## 2023-07-11 DIAGNOSIS — G629 Polyneuropathy, unspecified: Secondary | ICD-10-CM

## 2023-07-11 DIAGNOSIS — I10 Essential (primary) hypertension: Secondary | ICD-10-CM | POA: Insufficient documentation

## 2023-07-11 DIAGNOSIS — A09 Infectious gastroenteritis and colitis, unspecified: Secondary | ICD-10-CM | POA: Diagnosis present

## 2023-07-11 DIAGNOSIS — B359 Dermatophytosis, unspecified: Secondary | ICD-10-CM | POA: Diagnosis present

## 2023-07-11 DIAGNOSIS — N179 Acute kidney failure, unspecified: Secondary | ICD-10-CM

## 2023-07-11 DIAGNOSIS — Z96652 Presence of left artificial knee joint: Secondary | ICD-10-CM | POA: Diagnosis present

## 2023-07-11 DIAGNOSIS — Z90710 Acquired absence of both cervix and uterus: Secondary | ICD-10-CM | POA: Diagnosis not present

## 2023-07-11 DIAGNOSIS — Z7982 Long term (current) use of aspirin: Secondary | ICD-10-CM

## 2023-07-11 DIAGNOSIS — R54 Age-related physical debility: Secondary | ICD-10-CM | POA: Diagnosis present

## 2023-07-11 DIAGNOSIS — E039 Hypothyroidism, unspecified: Secondary | ICD-10-CM | POA: Diagnosis present

## 2023-07-11 DIAGNOSIS — Z79890 Hormone replacement therapy (postmenopausal): Secondary | ICD-10-CM

## 2023-07-11 DIAGNOSIS — K219 Gastro-esophageal reflux disease without esophagitis: Secondary | ICD-10-CM | POA: Diagnosis present

## 2023-07-11 DIAGNOSIS — F411 Generalized anxiety disorder: Secondary | ICD-10-CM | POA: Diagnosis present

## 2023-07-11 DIAGNOSIS — E876 Hypokalemia: Secondary | ICD-10-CM | POA: Insufficient documentation

## 2023-07-11 DIAGNOSIS — Z87891 Personal history of nicotine dependence: Secondary | ICD-10-CM

## 2023-07-11 DIAGNOSIS — K449 Diaphragmatic hernia without obstruction or gangrene: Secondary | ICD-10-CM | POA: Diagnosis present

## 2023-07-11 DIAGNOSIS — R6510 Systemic inflammatory response syndrome (SIRS) of non-infectious origin without acute organ dysfunction: Secondary | ICD-10-CM

## 2023-07-11 DIAGNOSIS — K529 Noninfective gastroenteritis and colitis, unspecified: Secondary | ICD-10-CM | POA: Diagnosis present

## 2023-07-11 DIAGNOSIS — E7800 Pure hypercholesterolemia, unspecified: Secondary | ICD-10-CM | POA: Diagnosis present

## 2023-07-11 LAB — PREPARE RBC (CROSSMATCH)

## 2023-07-11 LAB — COMPREHENSIVE METABOLIC PANEL WITH GFR
ALT: 15 U/L (ref 0–44)
AST: 25 U/L (ref 15–41)
Albumin: 4.1 g/dL (ref 3.5–5.0)
Alkaline Phosphatase: 93 U/L (ref 38–126)
Anion gap: 15 (ref 5–15)
BUN: 12 mg/dL (ref 8–23)
CO2: 23 mmol/L (ref 22–32)
Calcium: 9.9 mg/dL (ref 8.9–10.3)
Chloride: 98 mmol/L (ref 98–111)
Creatinine, Ser: 1.12 mg/dL — ABNORMAL HIGH (ref 0.44–1.00)
GFR, Estimated: 52 mL/min — ABNORMAL LOW
Glucose, Bld: 130 mg/dL — ABNORMAL HIGH (ref 70–99)
Potassium: 2.6 mmol/L — CL (ref 3.5–5.1)
Sodium: 136 mmol/L (ref 135–145)
Total Bilirubin: 0.5 mg/dL (ref 0.3–1.2)
Total Protein: 8.2 g/dL — ABNORMAL HIGH (ref 6.5–8.1)

## 2023-07-11 LAB — URINALYSIS, ROUTINE W REFLEX MICROSCOPIC
Bilirubin Urine: NEGATIVE
Glucose, UA: NEGATIVE mg/dL
Ketones, ur: NEGATIVE mg/dL
Nitrite: NEGATIVE
Protein, ur: NEGATIVE mg/dL
Specific Gravity, Urine: 1.01 (ref 1.005–1.030)
pH: 7 (ref 5.0–8.0)

## 2023-07-11 LAB — CBC
HCT: 48.7 % — ABNORMAL HIGH (ref 36.0–46.0)
Hemoglobin: 16.8 g/dL — ABNORMAL HIGH (ref 12.0–15.0)
MCH: 30 pg (ref 26.0–34.0)
MCHC: 34.5 g/dL (ref 30.0–36.0)
MCV: 87 fL (ref 80.0–100.0)
Platelets: 329 10*3/uL (ref 150–400)
RBC: 5.6 MIL/uL — ABNORMAL HIGH (ref 3.87–5.11)
RDW: 13.2 % (ref 11.5–15.5)
WBC: 14.8 10*3/uL — ABNORMAL HIGH (ref 4.0–10.5)
nRBC: 0 % (ref 0.0–0.2)

## 2023-07-11 LAB — HEMOGLOBIN AND HEMATOCRIT, BLOOD
HCT: 47.1 % — ABNORMAL HIGH (ref 36.0–46.0)
Hemoglobin: 15.8 g/dL — ABNORMAL HIGH (ref 12.0–15.0)

## 2023-07-11 LAB — CK: Total CK: 180 U/L (ref 38–234)

## 2023-07-11 LAB — MAGNESIUM: Magnesium: 1.6 mg/dL — ABNORMAL LOW (ref 1.7–2.4)

## 2023-07-11 LAB — COMPREHENSIVE METABOLIC PANEL
ALT: 14 U/L (ref 0–44)
AST: 21 U/L (ref 15–41)
Albumin: 3.6 g/dL (ref 3.5–5.0)
Alkaline Phosphatase: 85 U/L (ref 38–126)
Anion gap: 13 (ref 5–15)
BUN: 10 mg/dL (ref 8–23)
CO2: 24 mmol/L (ref 22–32)
Calcium: 9.1 mg/dL (ref 8.9–10.3)
Chloride: 99 mmol/L (ref 98–111)
Creatinine, Ser: 0.89 mg/dL (ref 0.44–1.00)
GFR, Estimated: >60 mL/min (ref >60)
Glucose, Bld: 103 mg/dL — ABNORMAL HIGH (ref 70–99)
Potassium: 2.8 mmol/L — ABNORMAL LOW (ref 3.5–5.1)
Sodium: 136 mmol/L (ref 135–145)
Total Bilirubin: 0.7 mg/dL (ref 0.3–1.2)
Total Protein: 7.8 g/dL (ref 6.5–8.1)

## 2023-07-11 LAB — LACTIC ACID, PLASMA: Lactic Acid, Venous: 1.6 mmol/L (ref 0.5–1.9)

## 2023-07-11 LAB — URINALYSIS, MICROSCOPIC (REFLEX)

## 2023-07-11 LAB — APTT: aPTT: 25 seconds (ref 24–36)

## 2023-07-11 LAB — PROTIME-INR
INR: 1 (ref 0.8–1.2)
Prothrombin Time: 13.3 seconds (ref 11.4–15.2)

## 2023-07-11 LAB — OCCULT BLOOD X 1 CARD TO LAB, STOOL: Fecal Occult Bld: POSITIVE — AB

## 2023-07-11 MED ORDER — ACETAMINOPHEN 325 MG PO TABS: 650.0000 mg | ORAL_TABLET | Freq: Four times a day (QID) | ORAL | Status: DC | PRN

## 2023-07-11 MED ORDER — POTASSIUM CHLORIDE 10 MEQ/100ML IV SOLN
10.0000 meq | INTRAVENOUS | Status: AC
  Administered 2023-07-11 – 2023-07-12 (×3): 10 meq via INTRAVENOUS
  Filled 2023-07-11 (×3): qty 100

## 2023-07-11 MED ORDER — SODIUM CHLORIDE 0.9 % IV SOLN
2.0000 g | Freq: Two times a day (BID) | INTRAVENOUS | Status: DC
  Administered 2023-07-12 – 2023-07-13 (×5): 2 g via INTRAVENOUS
  Filled 2023-07-11 (×5): qty 12.5

## 2023-07-11 MED ORDER — OXYCODONE HCL 5 MG PO TABS: 5.0000 mg | ORAL_TABLET | ORAL | Status: DC | PRN

## 2023-07-11 MED ORDER — LEVOTHYROXINE SODIUM 50 MCG PO TABS: 75.0000 ug | ORAL_TABLET | Freq: Every day | ORAL | Status: DC

## 2023-07-11 MED ORDER — ATORVASTATIN CALCIUM 40 MG PO TABS: 80.0000 mg | ORAL_TABLET | Freq: Every day | ORAL | Status: DC

## 2023-07-11 MED ORDER — POTASSIUM CHLORIDE 10 MEQ/100ML IV SOLN: 10.0000 meq | INTRAVENOUS | Status: DC

## 2023-07-11 MED ORDER — SODIUM CHLORIDE 0.9% FLUSH: 3.0000 mL | Freq: Two times a day (BID) | INTRAVENOUS | Status: DC

## 2023-07-11 MED ORDER — METRONIDAZOLE 500 MG/100ML IV SOLN
500.0000 mg | Freq: Two times a day (BID) | INTRAVENOUS | Status: DC
  Administered 2023-07-12 – 2023-07-15 (×7): 500 mg via INTRAVENOUS
  Filled 2023-07-11 (×7): qty 100

## 2023-07-11 MED ORDER — ONDANSETRON HCL 4 MG/2ML IJ SOLN
4.0000 mg | Freq: Once | INTRAMUSCULAR | Status: AC
  Administered 2023-07-11: 4 mg via INTRAVENOUS
  Filled 2023-07-11: qty 2

## 2023-07-11 MED ORDER — POTASSIUM CHLORIDE 20 MEQ PO PACK
40.0000 meq | PACK | Freq: Once | ORAL | Status: AC
  Administered 2023-07-11: 40 meq via ORAL
  Filled 2023-07-11: qty 2

## 2023-07-11 MED ORDER — HYDRALAZINE HCL 20 MG/ML IJ SOLN: 10.0000 mg | Freq: Four times a day (QID) | INTRAMUSCULAR | Status: DC | PRN

## 2023-07-11 MED ORDER — POTASSIUM CHLORIDE 10 MEQ/100ML IV SOLN
10.0000 meq | INTRAVENOUS | Status: AC
  Administered 2023-07-11 (×3): 10 meq via INTRAVENOUS
  Filled 2023-07-11 (×3): qty 100

## 2023-07-11 MED ORDER — MAGNESIUM SULFATE 2 GM/50ML IV SOLN
2.0000 g | Freq: Once | INTRAVENOUS | Status: AC
  Administered 2023-07-11: 2 g via INTRAVENOUS
  Filled 2023-07-11: qty 50

## 2023-07-11 MED ORDER — AMLODIPINE BESYLATE 10 MG PO TABS: 5.0000 mg | ORAL_TABLET | Freq: Every day | ORAL | Status: DC

## 2023-07-11 MED ORDER — NYSTATIN 100000 UNIT/GM EX CREA
TOPICAL_CREAM | Freq: Two times a day (BID) | CUTANEOUS | Status: DC
  Filled 2023-07-11: qty 30

## 2023-07-11 MED ORDER — ONDANSETRON HCL 4 MG PO TABS: 4.0000 mg | ORAL_TABLET | Freq: Four times a day (QID) | ORAL | Status: DC | PRN

## 2023-07-11 MED ORDER — IOHEXOL 300 MG/ML  SOLN
100.0000 mL | Freq: Once | INTRAMUSCULAR | Status: AC | PRN
  Administered 2023-07-11: 100 mL via INTRAVENOUS

## 2023-07-11 MED ORDER — SODIUM CHLORIDE 0.9 % IV SOLN: 250.0000 mL | INTRAVENOUS | Status: DC | PRN

## 2023-07-11 MED ORDER — ONDANSETRON HCL 4 MG/2ML IJ SOLN
4.0000 mg | Freq: Four times a day (QID) | INTRAMUSCULAR | Status: DC | PRN
  Administered 2023-07-12: 4 mg via INTRAVENOUS
  Filled 2023-07-11: qty 2

## 2023-07-11 MED ORDER — SODIUM CHLORIDE 0.9% FLUSH: 3.0000 mL | INTRAVENOUS | Status: DC | PRN

## 2023-07-11 MED ORDER — SODIUM CHLORIDE 0.9 % IV SOLN: Freq: Once | INTRAVENOUS | Status: AC

## 2023-07-11 MED ORDER — GABAPENTIN 300 MG PO CAPS: 300.0000 mg | ORAL_CAPSULE | Freq: Two times a day (BID) | ORAL | Status: DC

## 2023-07-11 MED ORDER — MORPHINE SULFATE (PF) 2 MG/ML IV SOLN
2.0000 mg | INTRAVENOUS | Status: DC | PRN
  Administered 2023-07-12 (×2): 2 mg via INTRAVENOUS
  Filled 2023-07-11 (×2): qty 1

## 2023-07-11 MED ORDER — POTASSIUM CHLORIDE CRYS ER 20 MEQ PO TBCR
20.0000 meq | EXTENDED_RELEASE_TABLET | Freq: Every day | ORAL | Status: DC
  Administered 2023-07-12 – 2023-07-15 (×4): 20 meq via ORAL
  Filled 2023-07-11 (×4): qty 1

## 2023-07-11 MED ORDER — PIPERACILLIN-TAZOBACTAM 3.375 G IVPB 30 MIN
3.3750 g | Freq: Once | INTRAVENOUS | Status: AC
  Administered 2023-07-11: 3.375 g via INTRAVENOUS
  Filled 2023-07-11: qty 50

## 2023-07-11 MED ORDER — DEXTROSE IN LACTATED RINGERS 5 % IV SOLN: INTRAVENOUS | Status: AC

## 2023-07-11 MED ORDER — ACETAMINOPHEN 650 MG RE SUPP: 650.0000 mg | Freq: Four times a day (QID) | RECTAL | Status: DC | PRN

## 2023-07-11 MED ORDER — SODIUM CHLORIDE 0.9 % IV BOLUS
1000.0000 mL | Freq: Once | INTRAVENOUS | Status: AC
  Administered 2023-07-11: 1000 mL via INTRAVENOUS

## 2023-07-11 MED ORDER — MORPHINE SULFATE (PF) 2 MG/ML IV SOLN: 2.0000 mg | INTRAVENOUS | Status: DC | PRN

## 2023-07-11 MED ORDER — LACTATED RINGERS IV SOLN: INTRAVENOUS | Status: DC

## 2023-07-11 MED ORDER — PIPERACILLIN-TAZOBACTAM 4.5 G IVPB
4.5000 g | Freq: Once | INTRAVENOUS | Status: DC
Start: 2023-07-11 — End: 2023-07-11

## 2023-07-11 NOTE — Plan of Care (Signed)
72 yo F presented with rectal bleeding and vomiting since last night initally  had diarrhea but followed by blood in stool no recent abx or food poisoning that she knows of 3-4 episodes of blood and clots w abd cramping On Aspirin  On arrival tachy to 101 WBC 14  HG 16 K of 2.6 Hemoccult positive CT scan overall appears does show fairly extensive colitis. Could be inflammatory or infectious.  Started on Zosyn Given KCl 3 runs Asked for 3 more  Asked for CK mag Send GI pannel  She had blood on rectal exam but not much stool in the rectal vault. I have made Dr. Dulce Sellar with GI aware  Accepted to progressive Therisa Doyne 6:49 PM

## 2023-07-11 NOTE — ED Notes (Signed)
Pt. Son took the Pt. Cell phone and the Pt. Cane home with him

## 2023-07-11 NOTE — ED Notes (Signed)
Patients Son Merryn Thaker [phone]

## 2023-07-11 NOTE — ED Notes (Signed)
[facility] called for transport. 

## 2023-07-11 NOTE — ED Triage Notes (Signed)
Bright red rectal bleeding and vomiting since last night.

## 2023-07-11 NOTE — ED Notes (Signed)
Patient transported to CT 

## 2023-07-11 NOTE — Progress Notes (Signed)
Pharmacy Antibiotic Note  [name] Aguilar is a 72 y.o. female admitted on 07/11/2023 with intra-abdominal infection.  Patient received Zosyn 3.375g IV x 1 dose in ED.  Pharmacy has been consulted for Cefepime dosing.  Plan: Cefepime 2g IV q12h Metronidazole 500mg  IV q12h  Height: 5\' 4"  (162.6 cm) Weight: 81.6 kg (180 lb) IBW/kg (Calculated) : 54.7  Temp (24hrs), Avg:98 F (36.7 C), Min:97.8 F (36.6 C), Max:98.1 F (36.7 C)  Recent Labs  Lab 07/11/23 1537  WBC 14.8*  CREATININE 1.12*    Estimated Creatinine Clearance: 46.3 mL/min (A) (by C-G formula based on SCr of 1.12 mg/dL (H)).    No Known Allergies  Antimicrobials this admission: 8/3 Zosyn x 1 8/4 Metronidazole >>   8/4 Cefepime >>    Dose adjustments this admission:    Microbiology results: 8/3 BCx:   8/3 CDiff:    8/3 GI Panel:       Thank you for allowing pharmacy to be a part of this patient's care.  Maryellen Pile, PharmD 07/11/2023 10:21 PM

## 2023-07-11 NOTE — ED Notes (Addendum)
Pt on bedpan, minimal assist

## 2023-07-11 NOTE — ED Notes (Signed)
Per Pt. She started having pain in her abd. With some bloody stools last night.  Pt. Reports she had an episode or more of vomiting also.

## 2023-07-11 NOTE — H&P (Addendum)
History and Physical    [name] Aguilar ZOX:[id number] DOB: 23-Mar-1951 DOA: 07/11/2023  PCP: [name], MD   Patient coming from: Home   Chief Complaint:  Chief Complaint  Patient presents with   Rectal Bleeding    HPI:  [name] Aguilar is a 72 y.o. female with medical history significant of hypertension, hyperlipidemia, chronic osteoarthritis, peripheral neuropathy and hypothyroidism presented to emergency department at [facility] with complaining of 3-4 episodes of bright red blood per rectum and blood clot which has been a started around 7 AM today morning 07/11/2023.  Patient also with associated nausea and abdominal cramping.  Denies any fever and chills.  Denies any previous history of lower GI bleed.  Patient has colonoscopy 10 years ago which was unremarkable.  Patient takes aspirin 81 mg daily and Voltaren extended release 75 mg twice daily for management of osteoarthritis.  ED Course:  At presentation to ED patient found tachycardic 116 which has been improved to 78.  Blood pressure 128/95 which trended up to 170/92 and improved to 132/81.  O2 sat 97% room air.  CBC showed elevated WBC 14.8, RBC 5.6, hemoglobin 16, hematocrit 48 platelet 329. Fecal occult blood positive. P showed sodium 136, potassium low 2.6, chloride 90, bicarb 23, blood glucose 130, creatinine 1.12, BUN 12, calcium 9.9, protein 8.2, GFR 52 and anion gap 15.  Low mag 1.6. UA unremarkable except hemoglobin positive for leukocyte esterase positive. Pending C. difficile and GI panel.  CT abdomen pelvis with contrast showed colitis extending from the splenic flexure of the colon through the sigmoid likely infectious or inflammatory.  Small hiatal hernia. EDP physician discussed case with [facility] Dr. Dulce Sellar.  In the ED patient received KCl 10 x 3, NS 1 L bolus and Zosyn once and Zofran 4 mg once.  Patient has been transferred to [facility] for further evaluation and care of lower GI  bleed.  Review of Systems:  Review of Systems  Constitutional:  Negative for chills, fever, malaise/fatigue and weight loss.  Respiratory:  Negative for cough, hemoptysis, sputum production and shortness of breath.   Cardiovascular:  Negative for chest pain and leg swelling.  Gastrointestinal:  Positive for abdominal pain, blood in stool, melena and nausea. Negative for constipation, diarrhea, heartburn and vomiting.       Blood clot with bowel movement.  Genitourinary:  Negative for dysuria, frequency and urgency.  Musculoskeletal:  Negative for myalgias.  Neurological:  Negative for dizziness and headaches.  Psychiatric/Behavioral:  The patient is not nervous/anxious.     Past Medical History:  Diagnosis Date   Anemia    hx of   Arthritis    GERD (gastroesophageal reflux disease)    Headache(784.0)    hx of a long time ago, none recent   Hepatitis    STATES YELLOW JAUNDICE YRS AGO   Hypercholesteremia    Hypertension    Hypothyroidism    Rotator cuff tear    BILATERAL - SEVERE PAIN BOTH SHOULDERS   Seizures (HCC)    YEARS AGO - NONE NOW    Past Surgical History:  Procedure Laterality Date   ABDOMINAL HYSTERECTOMY     KNEE ARTHROSCOPY Right    SHOULDER OPEN ROTATOR CUFF REPAIR Right 11/09/2013   Procedure: RIGHT ROTATOR CUFF REPAIR SHOULDER OPEN/CLOSED MANIPULATION/ OPEN ACROMINECTOMY,;  Surgeon: Jacki Cones, MD;  Location: [facility];  Service: Orthopedics;  Laterality: Right;   SHOULDER OPEN ROTATOR CUFF REPAIR Left 01/31/2015   Procedure: LEFT ROTATOR  CUFF REPAIR SHOULDER OPEN WITH ACROMIONECTOMY AND GRAFT AND ANCHOR;  Surgeon: Jacki Cones, MD;  Location: [facility];  Service: Orthopedics;  Laterality: Left;   TOTAL KNEE ARTHROPLASTY Left 02/15/2014   Procedure: LEFT TOTAL KNEE ARTHROPLASTY;  Surgeon: Jacki Cones, MD;  Location: [facility];  Service: Orthopedics;  Laterality: Left;     reports that she has never smoked. Her smokeless tobacco use includes snuff. She  reports current alcohol use. She reports that she does not use drugs.  No Known Allergies  History reviewed. No pertinent family history.  Prior to Admission medications   Medication Sig Start Date End Date Taking? Authorizing Provider  amLODipine (NORVASC) 5 MG tablet Take 5 mg by mouth daily with breakfast.    [provider]  aspirin EC 81 MG tablet Take 81 mg by mouth daily.    [provider]  atorvastatin (LIPITOR) 80 MG tablet Take 80 mg by mouth daily.    [provider]  diclofenac (VOLTAREN) 75 MG EC tablet Take 75 mg by mouth 2 (two) times daily.    [provider]  docusate sodium 100 MG CAPS Take 100 mg by mouth 2 (two) times daily. 02/17/14   Porterfield, Amber, PA-C  gabapentin (NEURONTIN) 300 MG capsule Take 300 mg by mouth 2 (two) times daily.     [provider]  levothyroxine (SYNTHROID, LEVOTHROID) 75 MCG tablet Take 75 mcg by mouth daily before breakfast.    [provider]  lisinopril (PRINIVIL,ZESTRIL) 40 MG tablet Take 40 mg by mouth every morning.    [provider]  methocarbamol (ROBAXIN) 500 MG tablet Take 1 tablet (500 mg total) by mouth every 6 (six) hours as needed for muscle spasms. 02/01/15   Porterfield, Amber, PA-C  oxyCODONE (OXY IR/ROXICODONE) 5 MG immediate release tablet Take 1-3 tablets (5-15 mg total) by mouth every 4 (four) hours as needed for severe pain. 02/01/15   Porterfield, Amber, PA-C  potassium chloride SA (K-DUR,KLOR-CON) 20 MEQ tablet Take 20 mEq by mouth daily.    [provider]     Physical Exam: Vitals:   07/11/23 2000 07/11/23 2010 07/11/23 2015 07/11/23 2105  BP:  (!) 170/92 (!) 165/91 132/81  Pulse: 90 85 85 78  Resp: 18 17 18 18   Temp:  98.1 F (36.7 C)  97.8 F (36.6 C)  TempSrc:  Oral  Oral  SpO2: 96% 97% 96% 97%  Weight:      Height:        Physical Exam Constitutional:      General: She is not in acute distress.    Appearance: She is not  ill-appearing.  HENT:     Mouth/Throat:     Mouth: Mucous membranes are moist.  Eyes:     Pupils: Pupils are equal, round, and reactive to light.  Cardiovascular:     Rate and Rhythm: Normal rate and regular rhythm.     Pulses: Normal pulses.     Heart sounds: Normal heart sounds.  Pulmonary:     Effort: Pulmonary effort is normal.     Breath sounds: Normal breath sounds.  Abdominal:     General: Bowel sounds are normal. There is no distension.     Tenderness: There is no abdominal tenderness.  Musculoskeletal:        General: No swelling.     Cervical back: Neck supple.     Right lower leg: No edema.     Left lower leg: No edema.  Skin:    General: Skin is warm.     Capillary Refill: Capillary refill takes less than 2 seconds.     Coloration: Skin is not pale.     Findings: Rash present. No lesion.  Neurological:     Mental Status: She is alert and oriented to person, place, and time.  Psychiatric:        Mood and Affect: Mood normal.        Thought Content: Thought content normal.        Judgment: Judgment normal.      Labs on Admission: I have personally reviewed following labs and imaging studies  CBC: Recent Labs  Lab 07/11/23 1537 07/11/23 2140  WBC 14.8*  --   HGB 16.8* 15.8*  HCT 48.7* 47.1*  MCV 87.0  --   PLT 329  --    Basic Metabolic Panel: Recent Labs  Lab 07/11/23 1535 07/11/23 1537 07/11/23 2140  NA  --  136 136  K  --  2.6* 2.8*  CL  --  98 99  CO2  --  23 24  GLUCOSE  --  130* 103*  BUN  --  12 10  CREATININE  --  1.12* 0.89  CALCIUM  --  9.9 9.1  MG 1.6*  --   --    GFR: Estimated Creatinine Clearance: 58.2 mL/min (by C-G formula based on SCr of 0.89 mg/dL). Liver Function Tests: Recent Labs  Lab 07/11/23 1537 07/11/23 2140  AST 25 21  ALT 15 14  ALKPHOS 93 85  BILITOT 0.5 0.7  PROT 8.2* 7.8  ALBUMIN 4.1 3.6   No results for input(s): "LIPASE", "AMYLASE" in the last 168 hours. No results for input(s): "AMMONIA" in the  last 168 hours. Coagulation Profile: Recent Labs  Lab 07/11/23 2217  INR 1.0   Cardiac Enzymes: Recent Labs  Lab 07/11/23 1535  CKTOTAL 180   BNP (last 3 results) No results for input(s): "BNP" in the last 8760 hours. HbA1C: No results for input(s): "HGBA1C" in the last 72 hours. CBG: No results for input(s): "GLUCAP" in the last 168 hours. Lipid Profile: No results for input(s): "CHOL", "HDL", "LDLCALC", "TRIG", "CHOLHDL", "LDLDIRECT" in the last 72 hours. Thyroid Function Tests: No results for input(s): "TSH", "T4TOTAL", "FREET4", "T3FREE", "THYROIDAB" in the last 72 hours. Anemia Panel: No results for input(s): "VITAMINB12", "FOLATE", "FERRITIN", "TIBC", "IRON", "RETICCTPCT" in the last 72 hours. Urine analysis:    Component Value Date/Time   COLORURINE YELLOW 07/11/2023 1825   APPEARANCEUR CLEAR 07/11/2023 1825   LABSPEC 1.010 07/11/2023 1825   PHURINE 7.0 07/11/2023 1825   GLUCOSEU NEGATIVE 07/11/2023 1825   HGBUR MODERATE (A) 07/11/2023 1825   BILIRUBINUR NEGATIVE 07/11/2023 1825   KETONESUR NEGATIVE 07/11/2023 1825   PROTEINUR NEGATIVE 07/11/2023 1825   UROBILINOGEN 0.2 01/18/2015 1100   NITRITE NEGATIVE 07/11/2023 1825   LEUKOCYTESUR SMALL (A) 07/11/2023 1825    Radiological Exams on Admission: I have personally reviewed images CT ABDOMEN PELVIS W CONTRAST  Result Date: 07/11/2023 CLINICAL DATA:  Abdominal pain, acute, nonlocalized colitis EXAM: CT ABDOMEN AND PELVIS WITH CONTRAST TECHNIQUE: Multidetector CT imaging of the abdomen and pelvis was performed using the standard protocol following bolus administration of intravenous contrast. RADIATION DOSE REDUCTION: This exam was performed according to the departmental dose-optimization program which includes automated exposure control, adjustment of the mA and/or kV according to patient size and/or use of iterative reconstruction technique. CONTRAST:  OMNIPAQUE IOHEXOL 300 MG/ML  SOLN COMPARISON:  Remote  CT  06/13/2007 FINDINGS: Lower chest: No basilar airspace disease or pleural effusion. Hepatobiliary: 9 mm subcapsular hypodensity in the right hepatic lobe, too small to characterize. No further follow-up imaging is recommended. No suspicious liver lesion. The right dome of the liver is included on delayed phase. Partially distended gallbladder without gallstone, pericholecystic inflammation or biliary dilatation. There is a Phrygian cap. Pancreas: Unremarkable. No pancreatic ductal dilatation or surrounding inflammatory changes. Spleen: Normal in size without focal abnormality. Adrenals/Urinary Tract: Normal adrenal glands. No hydronephrosis or perinephric edema. Homogeneous renal enhancement with symmetric excretion on delayed phase imaging. No renal calculi or focal renal abnormality. The urinary bladder is near completely empty, not well assessed. Stomach/Bowel: Small hiatal hernia. The stomach is otherwise unremarkable. There is colonic wall thickening with pericolonic edema extending from the splenic flexure of the colon through the sigmoid. No bowel pneumatosis or perforation. No focal diverticula to suggest diverticulitis. Diminutive normal appendix visualized. No small bowel obstruction or inflammation. Vascular/Lymphatic: Normal caliber abdominal aorta. No acute vascular findings. No abdominopelvic adenopathy. Scattered small central mesenteric lymph nodes, not enlarged by size criteria, likely reactive. Reproductive: Hysterectomy. The previous right pelvic dermoid has been resected. No adnexal mass. Other: No free air, free fluid, or intra-abdominal fluid collection. No abdominal wall hernia. Musculoskeletal: Diffuse lumbar facet hypertrophy. There are no acute or suspicious osseous abnormalities. IMPRESSION: 1. Colitis extending from the splenic flexure of the colon through the sigmoid, likely infectious or inflammatory. 2. Small hiatal hernia. Electronically Signed   By: Narda Rutherford M.D.   On:  07/11/2023 17:53    EKG: My personal interpretation of EKG shows: Sinus rhythm heart rate 94.  No ST and T wave abnormality.     Assessment/Plan: Principal Problem:   Colitis Active Problems:   Acute lower GI bleeding   Chronic hypokalemia   Hypomagnesemia   BRBPR (bright red blood per rectum)   AKI (acute kidney injury) (HCC)   Essential hypertension   Hyperlipidemia   Peripheral neuropathy   Hypothyroidism   SIRS (systemic inflammatory response syndrome) (HCC)   GAD (generalized anxiety disorder)    Assessment and Plan: Bright red blood per rectum Acute lower GI bleed Colitis - Patient coming with complaining of abdominal cramping with associated blood clot per rectum for 4 episodes since last 24 hours.  She has associated nausea.  Denies any fever and chills. - CT abdomen pelvis with contrast showed colitis extending from splenic flexure likely infectious or inflammatory process.  Small hiatal hernia. - Labs showed WBC count 14.8.  Pending lactic acid level. -Pending GI panel, C. difficile panel and stool calprotectin. -Pending blood cultures. -In the ED patient received Zosyn once and NS 1 L once. -Lab check showed hemoglobin 16.8 and hematocrit 48.7 on presentation. -Status And screen and preparing to  2 units of blood. -Continue to monitor H&H every 6 hours.  If patient develop sudden drop of hemoglobin or become hemodynamically unstable with large volume of blood per rectum will transfuse blood. -Continue empiric antibiotic coverage with cefepime (avoiding Zosyn in setting of AKI) and metronidazole. - ED physician consulted [facility] Dr. Dulce Sellar.   I have reached out to Dr. Dulce Sellar for further recommendation. -Keep patient NPO. -Holding home aspirin 81 mg daily and need to stop Voltaren 75 mg twice daily on discharge. Continue maintenance fluid D5 LR 75 cc/h - Continue morphine 2 mg every 4 hours as needed. -Continue cardiac monitoring.  SIRS Colitis -Patient  reported nausea and vomiting due to abdominal pain.  Denies any fever and chills.  Denies any recent antibio - CT abdomen pelvis showed evidence of colitis from splenic flexure extending up to sigmoid colon.tic use. -Slightly WBC count 14.  Patient is afebrile and hemodynamically stable. -Elevated WBC count, tachycardia 116 which improved to 78, and CT abdomen pelvis showed source of infection patient meets SIRS criteria. -Checking lactic acid level. - Continue empiric antibiotic coverage with cefepime and metronidazole - Checking GI panel, C. difficile panel and blood culture. -Will follow-up with culture result for antibiotic guidance. -Trend fever, checking CBC and CMP.   Gastroesophageal reflux disease - Patient takes Prilosec 20 mg daily at home. - Even though patient has lower GI bleed just to be precautions and starting Protonix 40 mg IV twice daily for now. -Checking stool H. pylori antigen.  Chronic hypokalemia - Patient has history of hypokalemia takes KCl oral 20 meq daily.   -Initial lab check showed low K 2.6. - Replating with oral KCl 40 mEq and IV KCl 10 x 6 -Trend electrolytes and replete as needed  Hypomagnesemia - Mag 1.6 - Repleted with mag sulfate IV 2 g.  Essential hypertension - On presentation blood pressure was 128/95 and 1 episode it trended up to 200/99.  Most recent blood pressure is 132/81 - At home patient is on amlodipine 10 mg daily and losartan 100 mg daily.  - Patient's blood pressure in the higher side.  Will able to tolerate oral blood pressure regimen. resuming amlodipine 10 mg daily.  Holding losartan in the setting of AKI. -Continue hydralazine 10 mg as needed every 6 hour with systolic blood pressure more than 170.   Prerenal AKI - Patient has normal renal function at the baseline. - Creatinine 1.12 on initial presentation.  Received 1 L of LR bolus in the ED prerenal AKI in the setting of poor oral intake due to nausea. -Continue maintenance  fluid D5 LR 75 cc/h. -Holding losartan in the setting of AKI -Monitor urine output - Avoid nephrotoxic agent.  Hyperlipidemia - For pharmacy record patient has not refilled Lipitor for last 6 months.  Will not resume Lipitor for now. - Checking lipid panel.  History of  peripheral neuropathy - Patient reported not taking gabapentin anymore.  She has not refilled gabapentin for last 6 months.  Holding to order gabapentin for now.  Lower abdominal rash - Physical exam showed lower abdominal rash likely fungal origin. - Continue nystatin cream to apply twice daily around the abdominal fold.  History of hypothyroidism - Pharmacy verified patient is not taking levothyroxine for last 6 months.  Holding levothyroxine for now. -Checking TSH and free T4 level.  Generalized anxiety disorder - Patient takes Atarax 50 mg at home in the morning.  Resuming at same dose  DVT prophylaxis:  SCDs Code Status:  Full Code Diet: Currently n.p.o. with ice chips and oral medication Family Communication: Discussed treatment plan with patient. Disposition Plan: Pending clinical disposition.  Plan to discharge to home next 2 to 3 days. Consults: Gastroenterology Admission status:   Inpatient,  progressive unit  Severity of Illness: The appropriate patient status for this patient is INPATIENT. Inpatient status is judged to be reasonable and necessary in order to provide the required intensity of service to ensure the patient's safety. The patient's presenting symptoms, physical exam findings, and initial radiographic and laboratory data in the context of their chronic comorbidities is felt to place them at high risk for further clinical deterioration. Furthermore, it is not anticipated that the patient  will be medically stable for discharge from the hospital within 2 midnights of admission.   * I certify that at the point of admission it is my clinical judgment that the patient will require inpatient hospital  care spanning beyond 2 midnights from the point of admission due to high intensity of service, high risk for further deterioration and high frequency of surveillance required.Marland Kitchen    Tereasa Coop, MD [facility]  How to contact the [facility] Attending or Consulting provider 7A - 7P or covering provider during after hours 7P -7A, for this patient.  Check the care team in [facility] and look for a) attending/consulting [facility] provider listed and b) the [facility] team listed Log into [email or web address] and use [facility]'s universal password to access. If you do not have the password, please contact the hospital operator. Locate the [facility] provider you are looking for under [facility] and page to a number that you can be directly reached. If you still have difficulty reaching the provider, please page the [facility] (Director on Call) for the Hospitalists listed on amion for assistance.  07/12/2023, 12:50 AM

## 2023-07-11 NOTE — ED Provider Notes (Signed)
[facility] Provider Note   CSN: [id number] Arrival date & time: 07/11/23  1511     History  Chief Complaint  Patient presents with   Rectal Bleeding    [name] Aguilar is a 72 y.o. female.  Patient here with rectal bleeding.  She has had 3-4 episodes of bright red blood and clots from her rectum since this morning around 7 AM.  She is having some nausea with it.  Some abdominal cramping with it.  Sounds like she had a colonoscopy maybe 10 years ago that she thought was unremarkable by a group in [facility].  She is on aspirin.  Does not take any blood thinners otherwise.  She still feels little bit nauseous.  Having some cramping pain.  Denies lightheadedness weakness or numbness or tingling.  Denies any fevers or chills.  Nothing makes it worse or better.  The history is provided by the patient.       Home Medications Prior to Admission medications   Medication Sig Start Date End Date Taking? Authorizing Provider  amLODipine (NORVASC) 5 MG tablet Take 5 mg by mouth daily with breakfast.    [provider]  aspirin EC 81 MG tablet Take 81 mg by mouth daily.    [provider]  atorvastatin (LIPITOR) 80 MG tablet Take 80 mg by mouth daily.    [provider]  diclofenac (VOLTAREN) 75 MG EC tablet Take 75 mg by mouth 2 (two) times daily.    [provider]  docusate sodium 100 MG CAPS Take 100 mg by mouth 2 (two) times daily. 02/17/14   Porterfield, Amber, PA-C  gabapentin (NEURONTIN) 300 MG capsule Take 300 mg by mouth 2 (two) times daily.     [provider]  levothyroxine (SYNTHROID, LEVOTHROID) 75 MCG tablet Take 75 mcg by mouth daily before breakfast.    [provider]  lisinopril (PRINIVIL,ZESTRIL) 40 MG tablet Take 40 mg by mouth every morning.    [provider]  methocarbamol (ROBAXIN) 500 MG tablet Take 1 tablet (500 mg total) by mouth every 6 (six) hours as needed  for muscle spasms. 02/01/15   Porterfield, Amber, PA-C  oxyCODONE (OXY IR/ROXICODONE) 5 MG immediate release tablet Take 1-3 tablets (5-15 mg total) by mouth every 4 (four) hours as needed for severe pain. 02/01/15   Porterfield, Amber, PA-C  potassium chloride SA (K-DUR,KLOR-CON) 20 MEQ tablet Take 20 mEq by mouth daily.    [provider]      Allergies    Patient has no known allergies.    Review of Systems   Review of Systems  Physical Exam Updated Vital Signs BP (!) 128/95 (BP Location: Left Arm)   Pulse (!) 116   Temp 98 F (36.7 C) (Oral)   Resp 16   Ht 5\' 4"  (1.626 m)   Wt 81.6 kg   SpO2 96%   BMI 30.90 kg/m  Physical Exam Vitals and nursing note reviewed.  Constitutional:      General: She is not in acute distress.    Appearance: She is well-developed. She is not ill-appearing.  HENT:     Head: Normocephalic and atraumatic.     Nose: Nose normal.     Mouth/Throat:     Mouth: Mucous membranes are moist.  Eyes:     Extraocular Movements: Extraocular movements intact.     Conjunctiva/sclera: Conjunctivae normal.     Pupils: Pupils are equal, round, and reactive to  light.  Cardiovascular:     Rate and Rhythm: Normal rate and regular rhythm.     Pulses: Normal pulses.     Heart sounds: Normal heart sounds. No murmur heard. Pulmonary:     Effort: Pulmonary effort is normal. No respiratory distress.     Breath sounds: Normal breath sounds.  Abdominal:     Palpations: Abdomen is soft.     Tenderness: There is abdominal tenderness.  Genitourinary:    Rectum: Guaiac result positive.  Musculoskeletal:        General: No swelling.     Cervical back: Normal range of motion and neck supple.  Skin:    General: Skin is warm and dry.     Capillary Refill: Capillary refill takes less than 2 seconds.  Neurological:     General: No focal deficit present.     Mental Status: She is alert.  Psychiatric:        Mood and Affect: Mood normal.     ED Results /  Procedures / Treatments   Labs (all labs ordered are listed, but only abnormal results are displayed) Labs Reviewed  COMPREHENSIVE METABOLIC PANEL - Abnormal; Notable for the following components:      Result Value   Potassium 2.6 (*)    Glucose, Bld 130 (*)    Creatinine, Ser 1.12 (*)    Total Protein 8.2 (*)    GFR, Estimated 52 (*)    All other components within normal limits  CBC - Abnormal; Notable for the following components:   WBC 14.8 (*)    RBC 5.60 (*)    Hemoglobin 16.8 (*)    HCT 48.7 (*)    All other components within normal limits  OCCULT BLOOD X 1 CARD TO LAB, STOOL - Abnormal; Notable for the following components:   Fecal Occult Bld POSITIVE (*)    All other components within normal limits    EKG None  Radiology CT ABDOMEN PELVIS W CONTRAST  Result Date: 07/11/2023 CLINICAL DATA:  Abdominal pain, acute, nonlocalized colitis EXAM: CT ABDOMEN AND PELVIS WITH CONTRAST TECHNIQUE: Multidetector CT imaging of the abdomen and pelvis was performed using the standard protocol following bolus administration of intravenous contrast. RADIATION DOSE REDUCTION: This exam was performed according to the departmental dose-optimization program which includes automated exposure control, adjustment of the mA and/or kV according to patient size and/or use of iterative reconstruction technique. CONTRAST:  OMNIPAQUE IOHEXOL 300 MG/ML  SOLN COMPARISON:  Remote CT 06/13/2007 FINDINGS: Lower chest: No basilar airspace disease or pleural effusion. Hepatobiliary: 9 mm subcapsular hypodensity in the right hepatic lobe, too small to characterize. No further follow-up imaging is recommended. No suspicious liver lesion. The right dome of the liver is included on delayed phase. Partially distended gallbladder without gallstone, pericholecystic inflammation or biliary dilatation. There is a Phrygian cap. Pancreas: Unremarkable. No pancreatic ductal dilatation or surrounding inflammatory changes.  Spleen: Normal in size without focal abnormality. Adrenals/Urinary Tract: Normal adrenal glands. No hydronephrosis or perinephric edema. Homogeneous renal enhancement with symmetric excretion on delayed phase imaging. No renal calculi or focal renal abnormality. The urinary bladder is near completely empty, not well assessed. Stomach/Bowel: Small hiatal hernia. The stomach is otherwise unremarkable. There is colonic wall thickening with pericolonic edema extending from the splenic flexure of the colon through the sigmoid. No bowel pneumatosis or perforation. No focal diverticula to suggest diverticulitis. Diminutive normal appendix visualized. No small bowel obstruction or inflammation. Vascular/Lymphatic: Normal caliber abdominal aorta. No acute vascular findings.  No abdominopelvic adenopathy. Scattered small central mesenteric lymph nodes, not enlarged by size criteria, likely reactive. Reproductive: Hysterectomy. The previous right pelvic dermoid has been resected. No adnexal mass. Other: No free air, free fluid, or intra-abdominal fluid collection. No abdominal wall hernia. Musculoskeletal: Diffuse lumbar facet hypertrophy. There are no acute or suspicious osseous abnormalities. IMPRESSION: 1. Colitis extending from the splenic flexure of the colon through the sigmoid, likely infectious or inflammatory. 2. Small hiatal hernia. Electronically Signed   By: Narda Rutherford M.D.   On: 07/11/2023 17:53    Procedures Procedures    Medications Ordered in ED Medications  potassium chloride 10 mEq in 100 mL IVPB (has no administration in time range)  ondansetron (ZOFRAN) injection 4 mg (has no administration in time range)  sodium chloride 0.9 % bolus 1,000 mL (has no administration in time range)  piperacillin-tazobactam (ZOSYN) IVPB 4.5 g (has no administration in time range)  iohexol (OMNIPAQUE) 300 MG/ML solution 100 mL (100 mLs Intravenous Contrast Given 07/11/23 1734)    ED Course/ Medical Decision  Making/ A&P                                 Medical Decision Making Amount and/or Complexity of Data Reviewed Labs: ordered. Radiology: ordered.  Risk Prescription drug management. Decision regarding hospitalization.   Celestial Barnfield is here with rectal bleeding.  Unremarkable vitals.  History of hypertension, high cholesterol.  Differential diagnosis colitis versus diverticular bleed/GI bleed versus less likely bowel obstruction or bowel perforation.  Will check CBC, CMP, Hemoccult, CT scan abdomen pelvis.  Hemoccult positive.  She had blood on rectal exam but not much stool in the rectal vault.  I have made Dr. Dulce Sellar with GI aware.  She is having several bowel movements that are bloody in appearance only.  But she is having some nausea and vomiting with it as well as some crampy abdominal pain.  Patient does have leukocytosis of 14 per my review and interpretation of labs.  Potassium 2.6.  Creatinine of 1.1.  Hemoccult was positive.  CT scan overall appears does show fairly extensive colitis.  Could be inflammatory or infectious.  Will cover with some IV antibiotics at this time.  Will admit for further hydration, potassium repletion, supportive care.  Will need to continue to trend hemoglobin as well.  This chart was dictated using voice recognition software.  Despite best efforts to proofread,  errors can occur which can change the documentation meaning.         Final Clinical Impression(s) / ED Diagnoses Final diagnoses:  Rectal bleeding  Colitis    Rx / DC Orders ED Discharge Orders     None         Virgina Norfolk, DO 07/11/23 1757

## 2023-07-11 NOTE — ED Notes (Signed)
Patient denies pain and is resting comfortably.  

## 2023-07-12 ENCOUNTER — Inpatient Hospital Stay (HOSPITAL_COMMUNITY): Payer: Medicare HMO

## 2023-07-12 ENCOUNTER — Other Ambulatory Visit: Payer: Self-pay

## 2023-07-12 DIAGNOSIS — F411 Generalized anxiety disorder: Secondary | ICD-10-CM | POA: Insufficient documentation

## 2023-07-12 DIAGNOSIS — K529 Noninfective gastroenteritis and colitis, unspecified: Secondary | ICD-10-CM | POA: Diagnosis not present

## 2023-07-12 LAB — C DIFFICILE QUICK SCREEN W PCR REFLEX
C Diff antigen: NEGATIVE
C Diff interpretation: NOT DETECTED
C Diff toxin: NEGATIVE

## 2023-07-12 LAB — CBC WITH DIFFERENTIAL/PLATELET
Abs Immature Granulocytes: 0.04 10*3/uL (ref 0.00–0.07)
Basophils Absolute: 0 10*3/uL (ref 0.0–0.1)
Basophils Relative: 0 %
Eosinophils Absolute: 0.1 10*3/uL (ref 0.0–0.5)
Eosinophils Relative: 0 %
HCT: 47.3 % — ABNORMAL HIGH (ref 36.0–46.0)
Hemoglobin: 15.5 g/dL — ABNORMAL HIGH (ref 12.0–15.0)
Immature Granulocytes: 0 %
Lymphocytes Relative: 13 %
Lymphs Abs: 1.8 10*3/uL (ref 0.7–4.0)
MCH: 30.3 pg (ref 26.0–34.0)
MCHC: 32.8 g/dL (ref 30.0–36.0)
MCV: 92.4 fL (ref 80.0–100.0)
Monocytes Absolute: 1.1 10*3/uL — ABNORMAL HIGH (ref 0.1–1.0)
Monocytes Relative: 8 %
Neutro Abs: 10.7 10*3/uL — ABNORMAL HIGH (ref 1.7–7.7)
Neutrophils Relative %: 79 %
Platelets: 299 10*3/uL (ref 150–400)
RBC: 5.12 MIL/uL — ABNORMAL HIGH (ref 3.87–5.11)
RDW: 13.6 % (ref 11.5–15.5)
WBC: 13.7 10*3/uL — ABNORMAL HIGH (ref 4.0–10.5)
nRBC: 0 % (ref 0.0–0.2)

## 2023-07-12 LAB — HEMOGLOBIN AND HEMATOCRIT, BLOOD
HCT: 47.4 % — ABNORMAL HIGH (ref 36.0–46.0)
HCT: 45.5 % (ref 36.0–46.0)
HCT: 45.2 % (ref 36.0–46.0)
HCT: 46.4 % — ABNORMAL HIGH (ref 36.0–46.0)
HCT: 46.7 % — ABNORMAL HIGH (ref 36.0–46.0)
Hemoglobin: 15.4 g/dL — ABNORMAL HIGH (ref 12.0–15.0)
Hemoglobin: 14.9 g/dL (ref 12.0–15.0)
Hemoglobin: 14.8 g/dL (ref 12.0–15.0)
Hemoglobin: 15.1 g/dL — ABNORMAL HIGH (ref 12.0–15.0)
Hemoglobin: 15.1 g/dL — ABNORMAL HIGH (ref 12.0–15.0)

## 2023-07-12 LAB — LIPID PANEL
Cholesterol: 208 mg/dL — ABNORMAL HIGH (ref 0–200)
HDL: 103 mg/dL (ref >40)
LDL Cholesterol: 95 mg/dL (ref 0–99)
Total CHOL/HDL Ratio: 2 RATIO
Triglycerides: 52 mg/dL (ref <150)
VLDL: 10 mg/dL (ref 0–40)

## 2023-07-12 LAB — APTT: aPTT: 23 s — ABNORMAL LOW (ref 24–36)

## 2023-07-12 LAB — COMPREHENSIVE METABOLIC PANEL
ALT: 14 U/L (ref 0–44)
AST: 21 U/L (ref 15–41)
Albumin: 3.5 g/dL (ref 3.5–5.0)
Alkaline Phosphatase: 79 U/L (ref 38–126)
Anion gap: 12 (ref 5–15)
BUN: 9 mg/dL (ref 8–23)
CO2: 24 mmol/L (ref 22–32)
Calcium: 9.4 mg/dL (ref 8.9–10.3)
Chloride: 101 mmol/L (ref 98–111)
Creatinine, Ser: 0.79 mg/dL (ref 0.44–1.00)
GFR, Estimated: >60 mL/min (ref >60)
Glucose, Bld: 119 mg/dL — ABNORMAL HIGH (ref 70–99)
Potassium: 3.3 mmol/L — ABNORMAL LOW (ref 3.5–5.1)
Sodium: 137 mmol/L (ref 135–145)
Total Bilirubin: 0.3 mg/dL (ref 0.3–1.2)
Total Protein: 7.7 g/dL (ref 6.5–8.1)

## 2023-07-12 LAB — TSH: TSH: 3.518 u[IU]/mL (ref 0.350–4.500)

## 2023-07-12 LAB — T4, FREE: Free T4: 0.9 ng/dL (ref 0.61–1.12)

## 2023-07-12 LAB — MAGNESIUM: Magnesium: 1.8 mg/dL (ref 1.7–2.4)

## 2023-07-12 LAB — PROTIME-INR
INR: 1 (ref 0.8–1.2)
Prothrombin Time: 13.3 s (ref 11.4–15.2)

## 2023-07-12 MED ORDER — PANTOPRAZOLE SODIUM 40 MG IV SOLR
40.0000 mg | Freq: Two times a day (BID) | INTRAVENOUS | Status: DC
  Administered 2023-07-12 – 2023-07-15 (×8): 40 mg via INTRAVENOUS
  Filled 2023-07-12 (×8): qty 10

## 2023-07-12 MED ORDER — HYDROXYZINE HCL 50 MG PO TABS
50.0000 mg | ORAL_TABLET | Freq: Every morning | ORAL | Status: DC
  Administered 2023-07-12 – 2023-07-15 (×4): 50 mg via ORAL
  Filled 2023-07-12 (×5): qty 1

## 2023-07-12 MED ORDER — POTASSIUM CHLORIDE CRYS ER 20 MEQ PO TBCR
40.0000 meq | EXTENDED_RELEASE_TABLET | Freq: Once | ORAL | Status: AC
  Administered 2023-07-12: 40 meq via ORAL
  Filled 2023-07-12: qty 2

## 2023-07-12 MED ORDER — LIDOCAINE 5 % EX PTCH
1.0000 | MEDICATED_PATCH | CUTANEOUS | Status: DC
  Administered 2023-07-12 – 2023-07-14 (×3): 1 via TRANSDERMAL
  Filled 2023-07-12 (×3): qty 1

## 2023-07-12 MED ORDER — AMLODIPINE BESYLATE 10 MG PO TABS
10.0000 mg | ORAL_TABLET | Freq: Every day | ORAL | Status: DC
  Administered 2023-07-12: 10 mg via ORAL
  Filled 2023-07-12: qty 1

## 2023-07-12 MED ORDER — PANTOPRAZOLE SODIUM 40 MG PO TBEC: 40.0000 mg | DELAYED_RELEASE_TABLET | Freq: Every day | ORAL | Status: DC

## 2023-07-12 NOTE — Progress Notes (Signed)
PHARMACY NOTE -  Cefepime  Pharmacy has been assisting with dosing of cefepime for intra-abdominal infection. Will adjust cefepime to 2g IV q8 hr with improved renal function; further renal adjustments per institutional Pharmacy antibiotic protocol  Pharmacy will sign off, following peripherally for culture results, dose adjustments, and length of therapy. Please reconsult if a change in clinical status warrants re-evaluation of dosage.  Bernadene Person, PharmD, BCPS [phone] 07/12/2023, 1:22 PM

## 2023-07-12 NOTE — Plan of Care (Signed)
  Problem: Clinical Measurements: °Goal: Ability to maintain clinical measurements within normal limits will improve °Outcome: Progressing °  °Problem: Education: °Goal: Knowledge of General Education information will improve °Description: Including pain rating scale, medication(s)/side effects and non-pharmacologic comfort measures °Outcome: Not Progressing °  °Problem: Health Behavior/Discharge Planning: °Goal: Ability to manage health-related needs will improve °Outcome: Not Progressing °  °

## 2023-07-12 NOTE — Plan of Care (Signed)

## 2023-07-12 NOTE — Plan of Care (Signed)
  Problem: Clinical Measurements: Goal: Ability to maintain clinical measurements within normal limits will improve Outcome: Progressing   Problem: Activity: Goal: Risk for activity intolerance will decrease Outcome: Progressing   Problem: Nutrition: Goal: Adequate nutrition will be maintained Outcome: Progressing   Problem: Education: Goal: Knowledge of General Education information will improve Description: Including pain rating scale, medication(s)/side effects and non-pharmacologic comfort measures Outcome: Not Progressing   Problem: Health Behavior/Discharge Planning: Goal: Ability to manage health-related needs will improve Outcome: Not Progressing

## 2023-07-12 NOTE — Consult Note (Signed)
[facility] Consultation Note  Referring Provider: [facility] Primary Care Physician:  [name], MD Primary Gastroenterologist:  Gentry Fitz  Reason for Consultation:  hematochezia  HPI: [name] Aguilar is a 72 y.o. female admitted for hematochezia and lower abdominal pain.  No prior episodes.  Acute onset symptoms couple days ago.  No hematemesis or melena.  Imaging left-sided colitis.  Prior colonoscopy (elsewhere, can't find report).  No recent antibiotics, travel, sick contacts/exposures.   Past Medical History:  Diagnosis Date   Anemia    hx of   Arthritis    GERD (gastroesophageal reflux disease)    Headache(784.0)    hx of a long time ago, none recent   Hepatitis    STATES YELLOW JAUNDICE YRS AGO   Hypercholesteremia    Hypertension    Hypothyroidism    Rotator cuff tear    BILATERAL - SEVERE PAIN BOTH SHOULDERS   Seizures (HCC)    YEARS AGO - NONE NOW    Past Surgical History:  Procedure Laterality Date   ABDOMINAL HYSTERECTOMY     KNEE ARTHROSCOPY Right    SHOULDER OPEN ROTATOR CUFF REPAIR Right 11/09/2013   Procedure: RIGHT ROTATOR CUFF REPAIR SHOULDER OPEN/CLOSED MANIPULATION/ OPEN ACROMINECTOMY,;  Surgeon: Jacki Cones, MD;  Location: [facility];  Service: Orthopedics;  Laterality: Right;   SHOULDER OPEN ROTATOR CUFF REPAIR Left 01/31/2015   Procedure: LEFT ROTATOR CUFF REPAIR SHOULDER OPEN WITH ACROMIONECTOMY AND GRAFT AND ANCHOR;  Surgeon: Jacki Cones, MD;  Location: [facility];  Service: Orthopedics;  Laterality: Left;   TOTAL KNEE ARTHROPLASTY Left 02/15/2014   Procedure: LEFT TOTAL KNEE ARTHROPLASTY;  Surgeon: Jacki Cones, MD;  Location: [facility];  Service: Orthopedics;  Laterality: Left;    Prior to Admission medications   Medication Sig Start Date End Date Taking? Authorizing Provider  amLODipine (NORVASC) 10 MG tablet Take 10 mg by mouth daily.   Yes [provider]  diclofenac (VOLTAREN) 75 MG EC tablet Take 75 mg  by mouth daily.   Yes [provider]  hydrOXYzine (ATARAX) 50 MG tablet Take 50 mg by mouth every morning. 04/11/23  Yes [provider]  losartan (COZAAR) 100 MG tablet Take 100 mg by mouth daily. 07/02/23  Yes [provider]  omeprazole (PRILOSEC) 20 MG capsule TAKE 1 CAPSULE BY MOUTH ONCE DAILY BEFORE A MEAL 04/11/23  Yes [provider]  docusate sodium 100 MG CAPS Take 100 mg by mouth 2 (two) times daily. Patient not taking: Reported on 07/11/2023 02/17/14   Porterfield, [facility], PA-C  methocarbamol (ROBAXIN) 500 MG tablet Take 1 tablet (500 mg total) by mouth every 6 (six) hours as needed for muscle spasms. Patient not taking: Reported on 07/11/2023 02/01/15   Porterfield, [facility], PA-C  oxyCODONE (OXY IR/ROXICODONE) 5 MG immediate release tablet Take 1-3 tablets (5-15 mg total) by mouth every 4 (four) hours as needed for severe pain. Patient not taking: Reported on 07/11/2023 02/01/15   Fredia Sorrow, PA-C    Current Facility-Administered Medications  Medication Dose Route Frequency Provider Last Rate Last Admin   acetaminophen (TYLENOL) tablet 650 mg  650 mg Oral Q6H PRN Janalyn Shy, Subrina, MD       Or   acetaminophen (TYLENOL) suppository 650 mg  650 mg Rectal Q6H PRN Janalyn Shy, Subrina, MD       amLODipine (NORVASC) tablet 10 mg  10 mg Oral Q breakfast Sundil, Subrina, MD   10 mg at 07/12/23 0926   ceFEPIme (MAXIPIME) 2 g in sodium  chloride 0.9 % 100 mL IVPB  2 g Intravenous Q12H Poindexter, Leann T, RPH 200 mL/hr at 07/12/23 1217 2 g at 07/12/23 1217   dextrose 5 % in lactated ringers infusion   Intravenous Continuous Sundil, Subrina, MD 75 mL/hr at 07/12/23 1326 New Bag at 07/12/23 1326   hydrALAZINE (APRESOLINE) injection 10 mg  10 mg Intravenous Q6H PRN Sundil, Subrina, MD       hydrOXYzine (ATARAX) tablet 50 mg  50 mg Oral q morning Sundil, Subrina, MD   50 mg at 07/12/23 8119   metroNIDAZOLE (FLAGYL) IVPB 500 mg  500 mg Intravenous Q12H Sundil, Subrina, MD 100  mL/hr at 07/12/23 1323 500 mg at 07/12/23 1323   morphine (PF) 2 MG/ML injection 2 mg  2 mg Intravenous Q4H PRN Sundil, Subrina, MD   2 mg at 07/12/23 0107   nystatin cream (MYCOSTATIN)   Topical BID Janalyn Shy, Subrina, MD   Given at 07/11/23 2353   ondansetron [facility]) tablet 4 mg  4 mg Oral Q6H PRN Janalyn Shy, Subrina, MD       Or   ondansetron [facility]) injection 4 mg  4 mg Intravenous Q6H PRN Sundil, Subrina, MD   4 mg at 07/12/23 0107   pantoprazole (PROTONIX) injection 40 mg  40 mg Intravenous Q12H Sundil, Subrina, MD   40 mg at 07/12/23 0930   potassium chloride SA (KLOR-CON M) CR tablet 20 mEq  20 mEq Oral Daily Sundil, Subrina, MD   20 mEq at 07/12/23 1478    Allergies as of 07/11/2023   (No Known Allergies)    History reviewed. No pertinent family history.  Social History   Socioeconomic History   Marital status: Married    Spouse name: Not on file   Number of children: Not on file   Years of education: Not on file   Highest education level: Not on file  Occupational History   Not on file  Tobacco Use   Smoking status: Never   Smokeless tobacco: Current    Types: Snuff  Substance and Sexual Activity   Alcohol use: Yes    Comment: occasinal beer    Drug use: No   Sexual activity: Not on file  Other Topics Concern   Not on file  Social History Narrative   Not on file   Social Determinants of Health   Financial Resource Strain: Not on file  Food Insecurity: No Food Insecurity (07/12/2023)   Hunger Vital Sign    Worried About Running Out of Food in the Last Year: Never true    Ran Out of Food in the Last Year: Never true  Transportation Needs: No Transportation Needs (07/12/2023)   PRAPARE - Administrator, Civil Service (Medical): No    Lack of Transportation (Non-Medical): No  Physical Activity: Not on file  Stress: Not on file  Social Connections: Not on file  Intimate Partner Violence: Not At Risk (07/12/2023)   Humiliation, Afraid, Rape, and Kick  questionnaire    Fear of Current or Ex-Partner: No    Emotionally Abused: No    Physically Abused: No    Sexually Abused: No    Review of Systems: As per HPI, all others negative  Physical Exam: Vital signs in last 24 hours: Temp:  [97.8 F (36.6 C)-98.5 F (36.9 C)] 98.5 F (36.9 C) (08/04 1327) Pulse Rate:  [67-116] 71 (08/04 1327) Resp:  [8-24] 18 (08/04 1327) BP: (119-200)/(71-99) 119/73 (08/04 1327) SpO2:  [94 %-100 %] 95 % (08/04  1327) Weight:  [73.9 kg-81.6 kg] 73.9 kg (08/04 0500) Last BM Date : 07/11/23 General:   Alert,  Well-developed, well-nourished, pleasant and cooperative in NAD Head:  Normocephalic and atraumatic. Eyes:  Sclera clear, no icterus.   Conjunctiva pink. Ears:  Normal auditory acuity. Nose:  No deformity, discharge,  or lesions. Mouth:  No deformity or lesions.  Oropharynx pink & moist. Neck:  Supple; no masses or thyromegaly. Lungs:  No respiratory distress Abdomen:  Soft, mild lower abdominal tenderness, nondistended. No masses, hepatosplenomegaly or hernias noted. Without guarding, and without rebound.     Msk:  Symmetrical without gross deformities. Normal posture. Pulses:  Normal pulses noted. Extremities:  Without clubbing or edema. Neurologic:  Alert and  oriented x4;  grossly normal neurologically. Skin:  Intact without significant lesions or rashes. Psych:  Alert and cooperative. Normal mood and affect.   Lab Results: Recent Labs    07/11/23 1537 07/11/23 2140 07/12/23 0502 07/12/23 0503 07/12/23 1027 07/12/23 1307  WBC 14.8*  --  13.7*  --   --   --   HGB 16.8*   < > 15.5* 15.4* 14.9 14.8  HCT 48.7*   < > 47.3* 47.4* 45.5 45.2  PLT 329  --  299  --   --   --    < > = values in this interval not displayed.   BMET Recent Labs    07/11/23 1537 07/11/23 2140 07/12/23 0502  NA 136 136 137  K 2.6* 2.8* 3.3*  CL 98 99 101  CO2 23 24 24   GLUCOSE 130* 103* 119*  BUN 12 10 9   CREATININE 1.12* 0.89 0.79  CALCIUM 9.9 9.1 9.4    LFT Recent Labs    07/12/23 0502  PROT 7.7  ALBUMIN 3.5  AST 21  ALT 14  ALKPHOS 79  BILITOT 0.3   PT/INR Recent Labs    07/11/23 2217 07/12/23 0502  LABPROT 13.3 13.3  INR 1.0 1.0    Studies/Results: CT ABDOMEN PELVIS W CONTRAST  Result Date: 07/11/2023 CLINICAL DATA:  Abdominal pain, acute, nonlocalized colitis EXAM: CT ABDOMEN AND PELVIS WITH CONTRAST TECHNIQUE: Multidetector CT imaging of the abdomen and pelvis was performed using the standard protocol following bolus administration of intravenous contrast. RADIATION DOSE REDUCTION: This exam was performed according to the departmental dose-optimization program which includes automated exposure control, adjustment of the mA and/or kV according to patient size and/or use of iterative reconstruction technique. CONTRAST:  OMNIPAQUE IOHEXOL 300 MG/ML  SOLN COMPARISON:  Remote CT 06/13/2007 FINDINGS: Lower chest: No basilar airspace disease or pleural effusion. Hepatobiliary: 9 mm subcapsular hypodensity in the right hepatic lobe, too small to characterize. No further follow-up imaging is recommended. No suspicious liver lesion. The right dome of the liver is included on delayed phase. Partially distended gallbladder without gallstone, pericholecystic inflammation or biliary dilatation. There is a Phrygian cap. Pancreas: Unremarkable. No pancreatic ductal dilatation or surrounding inflammatory changes. Spleen: Normal in size without focal abnormality. Adrenals/Urinary Tract: Normal adrenal glands. No hydronephrosis or perinephric edema. Homogeneous renal enhancement with symmetric excretion on delayed phase imaging. No renal calculi or focal renal abnormality. The urinary bladder is near completely empty, not well assessed. Stomach/Bowel: Small hiatal hernia. The stomach is otherwise unremarkable. There is colonic wall thickening with pericolonic edema extending from the splenic flexure of the colon through the sigmoid. No bowel  pneumatosis or perforation. No focal diverticula to suggest diverticulitis. Diminutive normal appendix visualized. No small bowel obstruction or inflammation. Vascular/Lymphatic: Normal  caliber abdominal aorta. No acute vascular findings. No abdominopelvic adenopathy. Scattered small central mesenteric lymph nodes, not enlarged by size criteria, likely reactive. Reproductive: Hysterectomy. The previous right pelvic dermoid has been resected. No adnexal mass. Other: No free air, free fluid, or intra-abdominal fluid collection. No abdominal wall hernia. Musculoskeletal: Diffuse lumbar facet hypertrophy. There are no acute or suspicious osseous abnormalities. IMPRESSION: 1. Colitis extending from the splenic flexure of the colon through the sigmoid, likely infectious or inflammatory. 2. Small hiatal hernia. Electronically Signed   By: Narda Rutherford M.D.   On: 07/11/2023 17:53    Impression:   Lower abdominal discomfort, improving. Hematochezia, improving. Abnormal CT, left-sided colonic thickening. Overall symptoms most consistent with either acute infectious colitis or acute ischemic colitis.  Plan:   Awaiting stool studies. On empiric antibiotics. Clear liquid diet. If stool studies negative and symptoms improve, would manage medically and wouldn't plan any procedures as inpatient (though patient will ultimately need more elective outpatient screening colonoscopy). [facility] will follow.   LOS: 1 day   Anyjah Roundtree M  07/12/2023, 1:58 PM  Cell [phone] If no answer or after 5 PM call [phone]

## 2023-07-12 NOTE — Progress Notes (Signed)
PROGRESS NOTE  [name] Aguilar WUJ:[id number] DOB: Mar 13, 1951 DOA: 07/11/2023 PCP: [name], MD  HPI/Recap of past 24 hours: [name] Aguilar is a 72 y.o. female with medical history significant of hypertension, hyperlipidemia, chronic osteoarthritis, peripheral neuropathy and hypothyroidism presented to emergency department at [facility] with complaining of 3-4 episodes of bright red blood per rectum and blood clot which has been a started around 7 AM today morning 07/11/2023.  Patient also with associated nausea and abdominal cramping.  Denies any fever and chills.  Denies any previous history of lower GI bleed.  Patient has colonoscopy 10 years ago which was unremarkable.  Patient takes aspirin 81 mg daily and Voltaren extended release 75 mg twice daily for management of osteoarthritis.   07/12/23: The patient was seen and examined at bedside.  Apparently had another bloody stool overnight.  Serial H&H tolerating every 6 hours.  Denies any abdominal pain at this time.  Assessment/Plan: Principal Problem:   Colitis Active Problems:   Acute lower GI bleeding   Chronic hypokalemia   Hypomagnesemia   BRBPR (bright red blood per rectum)   AKI (acute kidney injury) (HCC)   Essential hypertension   Hyperlipidemia   Peripheral neuropathy   Hypothyroidism   SIRS (systemic inflammatory response syndrome) (HCC)   GAD (generalized anxiety disorder)  Bright red blood per rectum Acute lower GI bleed Colitis - CT abdomen pelvis with contrast showed colitis extending from splenic flexure likely infectious or inflammatory process.  Small hiatal hernia. - Labs showed WBC count 14.8.  C. difficile PCR negative.   -In the ED patient received Zosyn once and NS 1 L once. Currently on cefepime and IV Flagyl. Last hemoglobin 15.3 from 16.8. GI will see in consultation.   SIRS Colitis -Patient reported nausea and vomiting due to abdominal pain.  Denies any fever and chills.  Denies  any recent antibio - CT abdomen pelvis showed evidence of colitis from splenic flexure extending up to sigmoid colon.tic use. -Slightly WBC count 14.  Patient is afebrile and hemodynamically stable. -Elevated WBC count, tachycardia 116 which improved to 78, and CT abdomen pelvis showed source of infection patient meets SIRS criteria. - Continue empiric antibiotic coverage with cefepime and metronidazole Trend fever curve and WBCs    Gastroesophageal reflux disease - Patient takes Prilosec 20 mg daily at home. Continue Protonix 40 mg IV twice daily for now. -Checking stool H. pylori antigen.   Chronic hypokalemia, improving. Replete electrolytes as indicated.   Hypomagnesemia - Mag 1.6 - Repleted with mag sulfate IV 2 g. Repeat magnesium level in the morning.   Essential hypertension Hold off oral antihypertensives in the setting of GI bleed.  BPs are currently soft Closely monitor vital signs and maintain MAP greater than 65     Resolved prerenal AKI   Hyperlipidemia - For pharmacy record patient has not refilled Lipitor for last 6 months.  Will not resume Lipitor for now. - Checking lipid panel.   History of  peripheral neuropathy - Patient reported not taking gabapentin anymore.  She has not refilled gabapentin for last 6 months.  Holding to order gabapentin for now.   Lower abdominal rash - Physical exam showed lower abdominal rash likely fungal origin. - Continue nystatin cream to apply twice daily around the abdominal fold.   History of hypothyroidism - Pharmacy verified patient is not taking levothyroxine for last 6 months.  Holding levothyroxine for now. -Checking TSH and free T4 level.   Generalized anxiety disorder -  Patient takes Atarax 50 mg at home in the morning.  Resuming at same dose   DVT prophylaxis:  SCDs Code Status:  Full Code Diet: Clear liquid diet Family Communication: Discussed treatment plan with patient. Disposition Plan: Pending clinical  disposition.  Plan to discharge to home next 2 to 3 days. Consults: Gastroenterology Admission status:   Inpatient,  progressive unit      Status is: Inpatient The patient requires at least 2 midnights for further evaluation and treatment of present condition.    Objective: Vitals:   07/11/23 2105 07/12/23 0457 07/12/23 0500 07/12/23 0926  BP: 132/81 128/71  130/74  Pulse: 78 67    Resp: 18 20    Temp: 97.8 F (36.6 C) 98.1 F (36.7 C)    TempSrc: Oral Oral    SpO2: 97% 99%    Weight:   73.9 kg   Height:        Intake/Output Summary (Last 24 hours) at 07/12/2023 1023 Last data filed at 07/12/2023 2025 Gross per 24 hour  Intake 2381.45 ml  Output 1250 ml  Net 1131.45 ml   Filed Weights   07/11/23 1518 07/12/23 0500  Weight: 81.6 kg 73.9 kg    Exam:  General: 72 y.o. year-old female well developed well nourished in no acute distress.  Alert and oriented x3. Cardiovascular: Regular rate and rhythm with no rubs or gallops.  No thyromegaly or JVD noted.   Respiratory: Clear to auscultation with no wheezes or rales. Good inspiratory effort. Abdomen: Soft nondistended with normal bowel sounds x4 quadrants. Musculoskeletal: No lower extremity edema. 2/4 pulses in all 4 extremities. Skin: No ulcerative lesions noted or rashes, Psychiatry: Mood is appropriate for condition and setting   Data Reviewed: CBC: Recent Labs  Lab 07/11/23 1537 07/11/23 2140 07/12/23 0502 07/12/23 0503  WBC 14.8*  --  13.7*  --   NEUTROABS  --   --  10.7*  --   HGB 16.8* 15.8* 15.5* 15.4*  HCT 48.7* 47.1* 47.3* 47.4*  MCV 87.0  --  92.4  --   PLT 329  --  299  --    Basic Metabolic Panel: Recent Labs  Lab 07/11/23 1535 07/11/23 1537 07/11/23 2140 07/12/23 0502  NA  --  136 136 137  K  --  2.6* 2.8* 3.3*  CL  --  98 99 101  CO2  --  23 24 24   GLUCOSE  --  130* 103* 119*  BUN  --  12 10 9   CREATININE  --  1.12* 0.89 0.79  CALCIUM  --  9.9 9.1 9.4  MG 1.6*  --   --   --     GFR: Estimated Creatinine Clearance: 61.7 mL/min (by C-G formula based on SCr of 0.79 mg/dL). Liver Function Tests: Recent Labs  Lab 07/11/23 1537 07/11/23 2140 07/12/23 0502  AST 25 21 21   ALT 15 14 14   ALKPHOS 93 85 79  BILITOT 0.5 0.7 0.3  PROT 8.2* 7.8 7.7  ALBUMIN 4.1 3.6 3.5   No results for input(s): "LIPASE", "AMYLASE" in the last 168 hours. No results for input(s): "AMMONIA" in the last 168 hours. Coagulation Profile: Recent Labs  Lab 07/11/23 2217 07/12/23 0502  INR 1.0 1.0   Cardiac Enzymes: Recent Labs  Lab 07/11/23 1535  CKTOTAL 180   BNP (last 3 results) No results for input(s): "PROBNP" in the last 8760 hours. HbA1C: No results for input(s): "HGBA1C" in the last 72 hours. CBG: No results for  input(s): "GLUCAP" in the last 168 hours. Lipid Profile: Recent Labs    07/12/23 0502  CHOL 208*  HDL 103  LDLCALC 95  TRIG 52  CHOLHDL 2.0   Thyroid Function Tests: Recent Labs    07/12/23 0502  TSH 3.518  FREET4 0.90   Anemia Panel: No results for input(s): "VITAMINB12", "FOLATE", "FERRITIN", "TIBC", "IRON", "RETICCTPCT" in the last 72 hours. Urine analysis:    Component Value Date/Time   COLORURINE YELLOW 07/11/2023 1825   APPEARANCEUR CLEAR 07/11/2023 1825   LABSPEC 1.010 07/11/2023 1825   PHURINE 7.0 07/11/2023 1825   GLUCOSEU NEGATIVE 07/11/2023 1825   HGBUR MODERATE (A) 07/11/2023 1825   BILIRUBINUR NEGATIVE 07/11/2023 1825   KETONESUR NEGATIVE 07/11/2023 1825   PROTEINUR NEGATIVE 07/11/2023 1825   UROBILINOGEN 0.2 01/18/2015 1100   NITRITE NEGATIVE 07/11/2023 1825   LEUKOCYTESUR SMALL (A) 07/11/2023 1825   Sepsis Labs: @LABRCNTIP (procalcitonin:4,lacticidven:4)  ) Recent Results (from the past 240 hour(s))  C Difficile Quick Screen w PCR reflex     Status: None   Collection Time: 07/11/23  1:26 AM   Specimen: STOOL  Result Value Ref Range Status   C Diff antigen NEGATIVE NEGATIVE Final   C Diff toxin NEGATIVE NEGATIVE Final    C Diff interpretation No C. difficile detected.  Final    Comment: Performed at [facility], [address] [place], [place] [address]  Culture, blood (Routine X 2) w Reflex to ID Panel     Status: None (Preliminary result)   Collection Time: 07/11/23 10:17 PM   Specimen: BLOOD LEFT ARM  Result Value Ref Range Status   Specimen Description   Final    BLOOD LEFT ARM Performed at [facility], [address], [place], [place] [address]    Special Requests   Final    BOTTLES DRAWN AEROBIC ONLY Blood Culture adequate volume Performed at [facility], [address] [place], [place] [address]    Culture   Final    NO GROWTH < 12 HOURS Performed at [facility], [address], [place], [place] [address]    Report Status PENDING  Incomplete  Culture, blood (Routine X 2) w Reflex to ID Panel     Status: None (Preliminary result)   Collection Time: 07/11/23 10:17 PM   Specimen: BLOOD RIGHT ARM  Result Value Ref Range Status   Specimen Description   Final    BLOOD RIGHT ARM Performed at [facility], [address] [place], [place] [address]    Special Requests   Final    BOTTLES DRAWN AEROBIC ONLY Blood Culture adequate volume Performed at [facility], [address], [place], [place] [address]    Culture   Final    NO GROWTH < 12 HOURS Performed at [facility], [address], [place], [place] [address]    Report Status PENDING  Incomplete      Studies: CT ABDOMEN PELVIS W CONTRAST  Result Date: 07/11/2023 CLINICAL DATA:  Abdominal pain, acute, nonlocalized colitis EXAM: CT ABDOMEN AND PELVIS WITH CONTRAST TECHNIQUE: Multidetector CT imaging of the abdomen and pelvis was performed using the standard protocol following bolus administration of intravenous contrast. RADIATION DOSE REDUCTION: This exam was performed according to the departmental dose-optimization program which includes automated  exposure control, adjustment of the mA and/or kV according to patient size and/or use of iterative reconstruction technique. CONTRAST:  OMNIPAQUE IOHEXOL 300 MG/ML  SOLN  COMPARISON:  Remote CT 06/13/2007 FINDINGS: Lower chest: No basilar airspace disease or pleural effusion. Hepatobiliary: 9 mm subcapsular hypodensity in the right hepatic lobe, too small to characterize. No further follow-up imaging is recommended. No suspicious liver lesion. The right dome of the liver is included on delayed phase. Partially distended gallbladder without gallstone, pericholecystic inflammation or biliary dilatation. There is a Phrygian cap. Pancreas: Unremarkable. No pancreatic ductal dilatation or surrounding inflammatory changes. Spleen: Normal in size without focal abnormality. Adrenals/Urinary Tract: Normal adrenal glands. No hydronephrosis or perinephric edema. Homogeneous renal enhancement with symmetric excretion on delayed phase imaging. No renal calculi or focal renal abnormality. The urinary bladder is near completely empty, not well assessed. Stomach/Bowel: Small hiatal hernia. The stomach is otherwise unremarkable. There is colonic wall thickening with pericolonic edema extending from the splenic flexure of the colon through the sigmoid. No bowel pneumatosis or perforation. No focal diverticula to suggest diverticulitis. Diminutive normal appendix visualized. No small bowel obstruction or inflammation. Vascular/Lymphatic: Normal caliber abdominal aorta. No acute vascular findings. No abdominopelvic adenopathy. Scattered small central mesenteric lymph nodes, not enlarged by size criteria, likely reactive. Reproductive: Hysterectomy. The previous right pelvic dermoid has been resected. No adnexal mass. Other: No free air, free fluid, or intra-abdominal fluid collection. No abdominal wall hernia. Musculoskeletal: Diffuse lumbar facet hypertrophy. There are no acute or suspicious osseous abnormalities. IMPRESSION: 1.  Colitis extending from the splenic flexure of the colon through the sigmoid, likely infectious or inflammatory. 2. Small hiatal hernia. Electronically Signed   By: Narda Rutherford M.D.   On: 07/11/2023 17:53    Scheduled Meds:  amLODipine  10 mg Oral Q breakfast   hydrOXYzine  50 mg Oral q morning   nystatin cream   Topical BID   pantoprazole (PROTONIX) IV  40 mg Intravenous Q12H   potassium chloride SA  20 mEq Oral Daily    Continuous Infusions:  ceFEPime (MAXIPIME) IV Stopped (07/12/23 0151)   dextrose 5% lactated ringers 75 mL/hr at 07/12/23 0616   metronidazole Stopped (07/12/23 0502)     LOS: 1 day     Darlin Drop, MD [facility] Pager [phone]  If 7PM-7AM, please contact night-coverage [email or web address] Password [facility] 07/12/2023, 10:23 AM

## 2023-07-13 DIAGNOSIS — K529 Noninfective gastroenteritis and colitis, unspecified: Secondary | ICD-10-CM | POA: Diagnosis not present

## 2023-07-13 LAB — HEMOGLOBIN AND HEMATOCRIT, BLOOD
HCT: 46.8 % — ABNORMAL HIGH (ref 36.0–46.0)
HCT: 42.5 % (ref 36.0–46.0)
Hemoglobin: 14.8 g/dL (ref 12.0–15.0)
Hemoglobin: 13.9 g/dL (ref 12.0–15.0)

## 2023-07-13 LAB — CALPROTECTIN, FECAL

## 2023-07-13 NOTE — Progress Notes (Signed)
[facility] Gastroenterology Progress Note  [name] Aguilar 72 y.o. Apr 10, 1951  CC: Abdominal pain, rectal bleeding, diarrhea   Subjective: Patient seen and examined at bedside .  Feeling better today.  Abdominal pain has improved.  Diarrhea has improved.  Only had 1 bowel movement today.  No further bleeding.  ROS : Afebrile, negative for chest pain   Objective: Vital signs in last 24 hours: Vitals:   07/12/23 2003 07/13/23 0633  BP: 133/66 122/62  Pulse: 63   Resp: 18 18  Temp: 97.7 F (36.5 C) 98.6 F (37 C)  SpO2: 97% 100%    Physical Exam: Elderly patient, resting comfortably.  Not in acute distress.  Abdominal exam benign.   Lab Results: Recent Labs    07/11/23 1535 07/11/23 1537 07/12/23 0502 07/13/23 0352  NA  --    < > 137 136  K  --    < > 3.3* 3.7  CL  --    < > 101 102  CO2  --    < > 24 28  GLUCOSE  --    < > 119* 89  BUN  --    < > 9 6*  CREATININE  --    < > 0.79 0.80  CALCIUM  --    < > 9.4 9.0  MG 1.6*  --  1.8  --    < > = values in this interval not displayed.   Recent Labs    07/12/23 0502 07/13/23 0352  AST 21 14*  ALT 14 12  ALKPHOS 79 65  BILITOT 0.3 0.5  PROT 7.7 6.7  ALBUMIN 3.5 3.0*   Recent Labs    07/12/23 0502 07/12/23 0503 07/12/23 2157 07/13/23 0352  WBC 13.7*  --   --  12.2*  NEUTROABS 10.7*  --   --  8.6*  HGB 15.5*   < > 15.1* 13.6  HCT 47.3*   < > 46.7* 41.8  MCV 92.4  --   --  91.9  PLT 299  --   --  260   < > = values in this interval not displayed.   Recent Labs    07/11/23 2217 07/12/23 0502  LABPROT 13.3 13.3  INR 1.0 1.0      Assessment/Plan: -Abdominal pain.  CT scan concerning for colitis extending from splenic flexure to the sigmoid colon.  Could be ischemic colitis based on distribution. -Rectal bleeding.  Improving.  Recommendations -------------------------- -Follow GI pathogen panel.  Leukocytosis improved.  Normal hemoglobin.  Normal LFTs. -Currently on cefepime and Flagyl -GI will  follow.  If ongoing improvement in symptoms, consider switching antibiotics to oral tomorrow and then likely discharge. -May consider outpatient colonoscopy once acute issues are resolved.   Kathi Der MD, FACP 07/13/2023, 12:00 PM  Contact #  [phone]

## 2023-07-13 NOTE — Progress Notes (Signed)
PROGRESS NOTE  [name] Aguilar ZOX:[id number] DOB: [birth year] DOA: 07/11/2023 PCP: [name], MD  HPI/Recap of past 24 hours: [name] Aguilar is a 72 y.o. female with medical history significant of hypertension, hyperlipidemia, chronic osteoarthritis, peripheral neuropathy and hypothyroidism presented to emergency department at [name] with complaining of 3-4 episodes of bright red blood per rectum and blood clot which has been a started around 7 AM today morning 07/11/2023.  Patient also with associated nausea and abdominal cramping.  Denies any fever and chills.  Denies any previous history of lower GI bleed.  Patient has colonoscopy 10 years ago which was unremarkable.  Patient takes aspirin 81 mg daily and Voltaren extended release 75 mg twice daily for management of osteoarthritis.   07/13/23: Endorses 2 melanotic stools last night.  Also vomited once.  GI panel by PCR still pending.  Assessment/Plan: Principal Problem:   Colitis Active Problems:   Acute lower GI bleeding   Chronic hypokalemia   Hypomagnesemia   BRBPR (bright red blood per rectum)   AKI (acute kidney injury) (HCC)   Essential hypertension   Hyperlipidemia   Peripheral neuropathy   Hypothyroidism   SIRS (systemic inflammatory response syndrome) (HCC)   GAD (generalized anxiety disorder)  Hematochezia. Acute lower GI bleed Colitis - CT abdomen pelvis with contrast showed colitis extending from splenic flexure likely infectious or inflammatory process.  Small hiatal hernia. - Labs showed WBC count 14.8>> downtrending.  C. difficile PCR negative.   -In the ED patient received Zosyn once and NS 1 L once. Currently on cefepime and IV Flagyl. Last hemoglobin 13.6 from 15.3 from 16.8. Appreciate GIs recommendations. Serial H&H every 6 hours.   Sepsis secondary to colitis - CT abdomen pelvis showed evidence of colitis from splenic flexure extending up to sigmoid colon.tic use. -Elevated WBC  count, tachycardia 116 which improved to 78, and CT abdomen pelvis showed source of infection patient meets SIRS criteria. - Continue empiric antibiotic coverage with cefepime and metronidazole Trend fever curve and WBCs    Gastroesophageal reflux disease - Patient takes Prilosec 20 mg daily at home. Continue Protonix 40 mg IV twice daily. -Checking stool H. pylori antigen.   Chronic hypokalemia, improving. Replete electrolytes as indicated.   Resolved hypomagnesemia, post repletion - Mag 1.6>> 1.8   Essential hypertension Hold off oral antihypertensives in the setting of GI bleed.  BPs are currently soft Closely monitor vital signs and maintain MAP greater than 65     Resolved prerenal AKI   Hyperlipidemia - For pharmacy record patient has not refilled Lipitor for last 6 months.  Will not resume Lipitor for now. - Checking lipid panel.   History of  peripheral neuropathy - Patient reported not taking gabapentin anymore.  She has not refilled gabapentin for last 6 months.  Holding to order gabapentin for now.   Lower abdominal rash - Physical exam showed lower abdominal rash likely fungal origin. - Continue nystatin cream to apply twice daily around the abdominal fold.   History of hypothyroidism - Pharmacy verified patient is not taking levothyroxine for last 6 months.  Holding levothyroxine for now. -Checking TSH and free T4 level within normal limit   Generalized anxiety disorder - Patient takes Atarax 50 mg at home in the morning.  Resuming at same dose   DVT prophylaxis:  SCDs Code Status:  Full Code Diet: Clear liquid diet Family Communication: Discussed treatment plan with patient. Disposition Plan: Pending clinical disposition.  Plan to discharge to home in  the next 24 to 48 hours. Consults: Gastroenterology Admission status:   Inpatient,  progressive unit      Status is: Inpatient The patient requires at least 2 midnights for further evaluation and  treatment of present condition.    Objective: Vitals:   07/12/23 2003 07/13/23 0600 07/13/23 0633 07/13/23 1218  BP: 133/66  122/62 (!) 149/74  Pulse: 63   (!) 57  Resp: 18  18 16   Temp: 97.7 F (36.5 C)  98.6 F (37 C) (!) 97.5 F (36.4 C)  TempSrc: Oral  Oral Oral  SpO2: 97%  100% 97%  Weight:  76.4 kg    Height:        Intake/Output Summary (Last 24 hours) at 07/13/2023 1435 Last data filed at 07/13/2023 1143 Gross per 24 hour  Intake 2244.94 ml  Output 700 ml  Net 1544.94 ml   Filed Weights   07/11/23 1518 07/12/23 0500 07/13/23 0600  Weight: 81.6 kg 73.9 kg 76.4 kg    Exam:  General: 72 y.o. year-old femalet in no acute distress.  She is alert and wanted x 3. Cardiovascular: Regular rate and rhythm with no rubs or gallops.  No thyromegaly or JVD noted.   Respiratory: Clear to auscultation with no wheezes or rales. Good inspiratory effort. Abdomen: Soft mild tenderness with palpation diffusely. Musculoskeletal: No lower extremity edema. 2/4 pulses in all 4 extremities. Skin: No ulcerative lesions noted or rashes, Psychiatry: Mood is appropriate for condition and setting.   Data Reviewed: CBC: Recent Labs  Lab 07/11/23 1537 07/11/23 2140 07/12/23 0502 07/12/23 0503 07/12/23 1027 07/12/23 1307 07/12/23 1929 07/12/23 2157 07/13/23 0352  WBC 14.8*  --  13.7*  --   --   --   --   --  12.2*  NEUTROABS  --   --  10.7*  --   --   --   --   --  8.6*  HGB 16.8*   < > 15.5*   < > 14.9 14.8 15.1* 15.1* 13.6  HCT 48.7*   < > 47.3*   < > 45.5 45.2 46.4* 46.7* 41.8  MCV 87.0  --  92.4  --   --   --   --   --  91.9  PLT 329  --  299  --   --   --   --   --  260   < > = values in this interval not displayed.   Basic Metabolic Panel: Recent Labs  Lab 07/11/23 1535 07/11/23 1537 07/11/23 2140 07/12/23 0502 07/13/23 0352  NA  --  136 136 137 136  K  --  2.6* 2.8* 3.3* 3.7  CL  --  98 99 101 102  CO2  --  23 24 24 28   GLUCOSE  --  130* 103* 119* 89  BUN  --  12  10 9  6*  CREATININE  --  1.12* 0.89 0.79 0.80  CALCIUM  --  9.9 9.1 9.4 9.0  MG 1.6*  --   --  1.8  --    GFR: Estimated Creatinine Clearance: 62.7 mL/min (by C-G formula based on SCr of 0.8 mg/dL). Liver Function Tests: Recent Labs  Lab 07/11/23 1537 07/11/23 2140 07/12/23 0502 07/13/23 0352  AST 25 21 21  14*  ALT 15 14 14 12   ALKPHOS 93 85 79 65  BILITOT 0.5 0.7 0.3 0.5  PROT 8.2* 7.8 7.7 6.7  ALBUMIN 4.1 3.6 3.5 3.0*   No results for input(s): "LIPASE", "  AMYLASE" in the last 168 hours. No results for input(s): "AMMONIA" in the last 168 hours. Coagulation Profile: Recent Labs  Lab 07/11/23 2217 07/12/23 0502  INR 1.0 1.0   Cardiac Enzymes: Recent Labs  Lab 07/11/23 1535  CKTOTAL 180   BNP (last 3 results) No results for input(s): "PROBNP" in the last 8760 hours. HbA1C: No results for input(s): "HGBA1C" in the last 72 hours. CBG: No results for input(s): "GLUCAP" in the last 168 hours. Lipid Profile: Recent Labs    07/12/23 0502  CHOL 208*  HDL 103  LDLCALC 95  TRIG 52  CHOLHDL 2.0   Thyroid Function Tests: Recent Labs    07/12/23 0502  TSH 3.518  FREET4 0.90   Anemia Panel: No results for input(s): "VITAMINB12", "FOLATE", "FERRITIN", "TIBC", "IRON", "RETICCTPCT" in the last 72 hours. Urine analysis:    Component Value Date/Time   COLORURINE YELLOW 07/11/2023 1825   APPEARANCEUR CLEAR 07/11/2023 1825   LABSPEC 1.010 07/11/2023 1825   PHURINE 7.0 07/11/2023 1825   GLUCOSEU NEGATIVE 07/11/2023 1825   HGBUR MODERATE (A) 07/11/2023 1825   BILIRUBINUR NEGATIVE 07/11/2023 1825   KETONESUR NEGATIVE 07/11/2023 1825   PROTEINUR NEGATIVE 07/11/2023 1825   UROBILINOGEN 0.2 01/18/2015 1100   NITRITE NEGATIVE 07/11/2023 1825   LEUKOCYTESUR SMALL (A) 07/11/2023 1825   Sepsis Labs: @LABRCNTIP (procalcitonin:4,lacticidven:4)  ) Recent Results (from the past 240 hour(s))  C Difficile Quick Screen w PCR reflex     Status: None   Collection Time:  07/11/23  1:26 AM   Specimen: STOOL  Result Value Ref Range Status   C Diff antigen NEGATIVE NEGATIVE Final   C Diff toxin NEGATIVE NEGATIVE Final   C Diff interpretation No C. difficile detected.  Final    Comment: Performed at [facility], [address] [place], [place] [address]  Culture, blood (Routine X 2) w Reflex to ID Panel     Status: None (Preliminary result)   Collection Time: 07/11/23 10:17 PM   Specimen: BLOOD LEFT ARM  Result Value Ref Range Status   Specimen Description   Final    BLOOD LEFT ARM Performed at [facility], [address], [place], [place] [address]    Special Requests   Final    BOTTLES DRAWN AEROBIC ONLY Blood Culture adequate volume Performed at [facility], [address], [place], [place] [address]    Culture   Final    NO GROWTH 2 DAYS Performed at [facility], [address], [place], [place] [address]    Report Status PENDING  Incomplete  Culture, blood (Routine X 2) w Reflex to ID Panel     Status: None (Preliminary result)   Collection Time: 07/11/23 10:17 PM   Specimen: BLOOD RIGHT ARM  Result Value Ref Range Status   Specimen Description   Final    BLOOD RIGHT ARM Performed at [facility], [address] [place], [place] [address]    Special Requests   Final    BOTTLES DRAWN AEROBIC ONLY Blood Culture adequate volume Performed at [facility], [address], [place], [place] [address]    Culture   Final    NO GROWTH 2 DAYS Performed at [facility], [address], [place], [place] [address]    Report Status PENDING  Incomplete      Studies: DG Knee 1-2 Views Left  Result Date: 07/12/2023 CLINICAL DATA:  Left knee pain. Larey Seat and  twisted left knee about a month ago. Prior left knee arthroplasty in 2015. EXAM: LEFT KNEE - 1-2 VIEW COMPARISON:  Left knee radiographs 11/08/2019 FINDINGS: Postsurgical changes are again seen of total left knee  arthroplasty. There is again marked superior positioning of the patella. No perihardware lucency is seen to indicate hardware failure or loosening. No periprosthetic fracture is seen. No joint effusion. Moderate atherosclerotic calcifications. IMPRESSION: Postsurgical changes of total left knee arthroplasty. No evidence of hardware failure or loosening. Unchanged chronic high-grade superior positioning of the patella. Question chronic patellar tendon insufficiency. Electronically Signed   By: Neita Garnet M.D.   On: 07/12/2023 17:19    Scheduled Meds:  hydrOXYzine  50 mg Oral q morning   lidocaine  1 patch Transdermal Q24H   nystatin cream   Topical BID   pantoprazole (PROTONIX) IV  40 mg Intravenous Q12H   potassium chloride SA  20 mEq Oral Daily    Continuous Infusions:  ceFEPime (MAXIPIME) IV 2 g (07/13/23 1311)   metronidazole 500 mg (07/12/23 2319)     LOS: 2 days     Darlin Drop, MD [facility] Pager [phone]  If 7PM-7AM, please contact night-coverage [email or web address] Password [email or web address] 07/13/2023, 2:35 PM

## 2023-07-13 NOTE — TOC CM/SW Note (Signed)
Transition of Care [facility]) - Inpatient Brief Assessment   Patient Details  Name: Ernestine Fallin MRN: [id number] Date of Birth: August 31, 1951  Transition of Care [facility]) CM/SW Contact:    Howell Rucks, RN Phone Number: 07/13/2023, 4:00 PM   Clinical Narrative: Met with pt at bedside to introduce role of TOC/NCM and review for dc planning, Pt confirmed she has a PCP and pharmacy in place, no current home care services,  reports Home DME: walker, cane, potty chair, shower chair, pt reports she feels safe at home with support from her family. Pt reports her son will provide transportation at discharge.  TOC Brief Assessment completed. No TOC needs identified.     Transition of Care Asessment: Insurance and Status: Insurance coverage has been reviewed Patient has primary care physician: Yes Home environment has been reviewed: resides in an apartment with family support Prior level of function:: Independent with walker Prior/Current Home Services: No current home services Social Determinants of Health Reivew: SDOH reviewed no interventions necessary Readmission risk has been reviewed: Yes Transition of care needs: no transition of care needs at this time

## 2023-07-14 DIAGNOSIS — K529 Noninfective gastroenteritis and colitis, unspecified: Secondary | ICD-10-CM | POA: Diagnosis not present

## 2023-07-14 MED ORDER — AMLODIPINE BESYLATE 10 MG PO TABS
10.0000 mg | ORAL_TABLET | Freq: Every day | ORAL | Status: DC
  Administered 2023-07-14 – 2023-07-15 (×2): 10 mg via ORAL
  Filled 2023-07-14 (×3): qty 1

## 2023-07-14 MED ORDER — SODIUM CHLORIDE 0.9 % IV SOLN
2.0000 g | Freq: Three times a day (TID) | INTRAVENOUS | Status: DC
  Administered 2023-07-14 – 2023-07-15 (×3): 2 g via INTRAVENOUS
  Filled 2023-07-14 (×3): qty 12.5

## 2023-07-14 NOTE — Progress Notes (Signed)
[facility] Gastroenterology Progress Note  [name] Aguilar 72 y.o. Jan 13, 1951  CC: Abdominal pain, rectal bleeding, diarrhea   Subjective: Patient seen and examined at bedside .  She continues to feel better.  Abdominal pain has improved.  Diarrhea has also improved.  Discussed with RN at bedside.  Had few episodes of rectal bleeding yesterday.  Had 1 episode of vomiting after meal yesterday.  ROS : Afebrile, negative for chest pain   Objective: Vital signs in last 24 hours: Vitals:   07/13/23 2024 07/14/23 0516  BP: (!) 156/87 (!) 141/81  Pulse: 64 (!) 56  Resp: 17 16  Temp: 98.2 F (36.8 C) 98.4 F (36.9 C)  SpO2: 99% 97%    Physical Exam: Elderly patient, resting comfortably.  Not in acute distress.  Abdominal exam benign.   Lab Results: Recent Labs    07/11/23 1535 07/11/23 1537 07/12/23 0502 07/13/23 0352 07/14/23 0404  NA  --    < > 137 136 138  K  --    < > 3.3* 3.7 3.7  CL  --    < > 101 102 103  CO2  --    < > 24 28 25   GLUCOSE  --    < > 119* 89 82  BUN  --    < > 9 6* 6*  CREATININE  --    < > 0.79 0.80 0.79  CALCIUM  --    < > 9.4 9.0 9.6  MG 1.6*  --  1.8  --   --    < > = values in this interval not displayed.   Recent Labs    07/13/23 0352 07/14/23 0404  AST 14* 14*  ALT 12 11  ALKPHOS 65 69  BILITOT 0.5 0.6  PROT 6.7 6.9  ALBUMIN 3.0* 3.0*   Recent Labs    07/13/23 0352 07/13/23 1534 07/14/23 0404 07/14/23 0848  WBC 12.2*  --  8.1  --   NEUTROABS 8.6*  --  4.8  --   HGB 13.6   < > 14.0 14.2  HCT 41.8   < > 42.7 43.4  MCV 91.9  --  91.8  --   PLT 260  --  266  --    < > = values in this interval not displayed.   Recent Labs    07/11/23 2217 07/12/23 0502  LABPROT 13.3 13.3  INR 1.0 1.0      Assessment/Plan: -Abdominal pain.  CT scan concerning for colitis extending from splenic flexure to the sigmoid colon.  Could be ischemic colitis based on distribution. -Diarrhea.  Improving.  C. difficile negative. -Rectal bleeding.   Improving.  Recommendations -------------------------- -Follow GI pathogen panel.  Apparently, there was some technical issues with the collection.  Leukocytosis improved.  Normal hemoglobin.  Normal LFTs. -Currently on cefepime and Flagyl -Advance diet to soft.  If able to tolerate soft diet, then we can switch antibiotics to oral and likely consider discharge if able to tolerate diet and antibiotics.   Kathi Der MD, FACP 07/14/2023, 9:21 AM  Contact #  [phone]

## 2023-07-14 NOTE — Progress Notes (Signed)
PROGRESS NOTE  [name] Aguilar WUJ:[id number] DOB: 01/02/1951 DOA: 07/11/2023 PCP: [name], MD  HPI/Recap of past 24 hours: [name] Aguilar is a 72 y.o. female with medical history significant of hypertension, hyperlipidemia, chronic osteoarthritis, peripheral neuropathy and hypothyroidism presented to emergency department at [facility] with complaining of 3-4 episodes of bright red blood per rectum and blood clot which started around 7 AM 07/11/2023.  Associated with nausea and abdominal cramping.  No prior history of lower GI bleed.  Patient had colonoscopy 10 years ago which was unremarkable.  Takes aspirin 81 mg daily and Voltaren extended release 75 mg twice daily for management of osteoarthritis.  Workup in the ED revealed CT scan concerning for colitis extending from splenic flexure to the sigmoid colon.  She was started on empiric IV antibiotics, cefepime and IV Flagyl.  GI was consulted, following.  C. difficile PCR was negative.  GI panel by PCR still pending due to technical issues with the collection.  07/14/23: Endorses diarrhea is improving.  No recurrent bloody stools.  Asking for solid food.  Started on soft diet today by GI.  Per GI if she tolerates a soft diet can switch antibiotics to oral and consider discharge on p.o. antibiotics.  Assessment/Plan: Principal Problem:   Colitis Active Problems:   Acute lower GI bleeding   Chronic hypokalemia   Hypomagnesemia   BRBPR (bright red blood per rectum)   AKI (acute kidney injury) (HCC)   Essential hypertension   Hyperlipidemia   Peripheral neuropathy   Hypothyroidism   SIRS (systemic inflammatory response syndrome) (HCC)   GAD (generalized anxiety disorder)  Resolved hematochezia. Resolved acute lower GI bleed Improving colitis - CT abdomen pelvis with contrast showed colitis extending from splenic flexure likely infectious or inflammatory process.  Small hiatal hernia. - Labs showed WBC count 14.8>>  downtrending.  C. difficile PCR negative.   -In the ED patient received Zosyn once and NS 1 L once. Currently on cefepime and IV Flagyl. Switch IV antibiotics to oral antibiotics if tolerates soft diet. Hemoglobin is improving and uptrending.   Sepsis secondary to colitis, POA Sepsis criteria has resolved. - CT abdomen pelvis showed evidence of colitis from splenic flexure extending up to sigmoid colon.tic use. Presented with elevated WBC count, tachycardia 116, CT abdomen pelvis showed source of infection Currently afebrile with no leukocytosis.    Gastroesophageal reflux disease - Patient takes Prilosec 20 mg daily at home. Continue Protonix 40 mg IV twice daily for now, switch to p.o. Protonix if she tolerates a soft diet. -Checking stool H. pylori antigen.   Resolved chronic hypokalemia, i post repletion. Replete electrolytes as indicated.   Resolved hypomagnesemia, post repletion - Mag 1.6>> 1.8   Essential hypertension Gradually resume home oral antihypertensives No longer bleeding. Continue to monitor vital signs.     Resolved prerenal AKI   Hyperlipidemia - For pharmacy record patient has not refilled Lipitor for last 6 months.  Will not resume Lipitor for now.   History of  peripheral neuropathy - Patient reported not taking gabapentin anymore.  She has not refilled gabapentin for last 6 months.  Holding to order gabapentin for now.   Lower abdominal rash - Physical exam showed lower abdominal rash likely fungal origin. - Continue nystatin cream to apply twice daily around the abdominal fold.   History of hypothyroidism - Pharmacy verified patient is not taking levothyroxine for last 6 months.  Holding levothyroxine for now.  TSH and free T4 level within normal  limit   Generalized anxiety disorder - Patient takes Atarax 50 mg at home in the morning.  Resuming at same dose   DVT prophylaxis:  SCDs Code Status:  Full Code Diet: Clear liquid diet Family  Communication: Discussed treatment plan with patient. Disposition Plan: Pending clinical disposition.  Plan to discharge to home in the next 24 hours if tolerates a soft diet. Consults: Gastroenterology Admission status:   Inpatient,  progressive unit, transfer to telemetry.      Status is: Inpatient The patient requires at least 2 midnights for further evaluation and treatment of present condition.    Objective: Vitals:   07/13/23 2024 07/14/23 0516 07/14/23 0625 07/14/23 1208  BP: (!) 156/87 (!) 141/81  (!) 140/84  Pulse: 64 (!) 56  72  Resp: 17 16  17   Temp: 98.2 F (36.8 C) 98.4 F (36.9 C)  98.3 F (36.8 C)  TempSrc: Oral Oral  Oral  SpO2: 99% 97%  96%  Weight:   78.8 kg   Height:        Intake/Output Summary (Last 24 hours) at 07/14/2023 1528 Last data filed at 07/14/2023 1212 Gross per 24 hour  Intake 1040 ml  Output 900 ml  Net 140 ml   Filed Weights   07/12/23 0500 07/13/23 0600 07/14/23 0625  Weight: 73.9 kg 76.4 kg 78.8 kg    Exam:  General: 72 y.o. year-old femalet frail-appearing no acute or suspicious alert and oriented x 3. Cardiovascular: Regular rate and rhythm no rubs or gallops. Respiratory: Clear to auscultation with no wheezes or rales. Good inspiratory effort. Abdomen: Soft with bowel sounds present. Musculoskeletal: No lower extremity edema. 2/4 pulses in all 4 extremities. Skin: No ulcerative lesions noted or rashes, Psychiatry: Mood is appropriate for condition setting.   Data Reviewed: CBC: Recent Labs  Lab 07/11/23 1537 07/11/23 2140 07/12/23 0502 07/12/23 0503 07/13/23 0352 07/13/23 1534 07/13/23 2118 07/14/23 0404 07/14/23 0848  WBC 14.8*  --  13.7*  --  12.2*  --   --  8.1  --   NEUTROABS  --   --  10.7*  --  8.6*  --   --  4.8  --   HGB 16.8*   < > 15.5*   < > 13.6 14.8 13.9 14.0 14.2  HCT 48.7*   < > 47.3*   < > 41.8 46.8* 42.5 42.7 43.4  MCV 87.0  --  92.4  --  91.9  --   --  91.8  --   PLT 329  --  299  --  260  --    --  266  --    < > = values in this interval not displayed.   Basic Metabolic Panel: Recent Labs  Lab 07/11/23 1535 07/11/23 1537 07/11/23 2140 07/12/23 0502 07/13/23 0352 07/14/23 0404  NA  --  136 136 137 136 138  K  --  2.6* 2.8* 3.3* 3.7 3.7  CL  --  98 99 101 102 103  CO2  --  23 24 24 28 25   GLUCOSE  --  130* 103* 119* 89 82  BUN  --  12 10 9  6* 6*  CREATININE  --  1.12* 0.89 0.79 0.80 0.79  CALCIUM  --  9.9 9.1 9.4 9.0 9.6  MG 1.6*  --   --  1.8  --   --    GFR: Estimated Creatinine Clearance: 63.6 mL/min (by C-G formula based on SCr of 0.79 mg/dL). Liver Function Tests:  Recent Labs  Lab 07/11/23 1537 07/11/23 2140 07/12/23 0502 07/13/23 0352 07/14/23 0404  AST 25 21 21  14* 14*  ALT 15 14 14 12 11   ALKPHOS 93 85 79 65 69  BILITOT 0.5 0.7 0.3 0.5 0.6  PROT 8.2* 7.8 7.7 6.7 6.9  ALBUMIN 4.1 3.6 3.5 3.0* 3.0*   No results for input(s): "LIPASE", "AMYLASE" in the last 168 hours. No results for input(s): "AMMONIA" in the last 168 hours. Coagulation Profile: Recent Labs  Lab 07/11/23 2217 07/12/23 0502  INR 1.0 1.0   Cardiac Enzymes: Recent Labs  Lab 07/11/23 1535  CKTOTAL 180   BNP (last 3 results) No results for input(s): "PROBNP" in the last 8760 hours. HbA1C: No results for input(s): "HGBA1C" in the last 72 hours. CBG: No results for input(s): "GLUCAP" in the last 168 hours. Lipid Profile: Recent Labs    07/12/23 0502  CHOL 208*  HDL 103  LDLCALC 95  TRIG 52  CHOLHDL 2.0   Thyroid Function Tests: Recent Labs    07/12/23 0502  TSH 3.518  FREET4 0.90   Anemia Panel: No results for input(s): "VITAMINB12", "FOLATE", "FERRITIN", "TIBC", "IRON", "RETICCTPCT" in the last 72 hours. Urine analysis:    Component Value Date/Time   COLORURINE YELLOW 07/11/2023 1825   APPEARANCEUR CLEAR 07/11/2023 1825   LABSPEC 1.010 07/11/2023 1825   PHURINE 7.0 07/11/2023 1825   GLUCOSEU NEGATIVE 07/11/2023 1825   HGBUR MODERATE (A) 07/11/2023 1825    BILIRUBINUR NEGATIVE 07/11/2023 1825   KETONESUR NEGATIVE 07/11/2023 1825   PROTEINUR NEGATIVE 07/11/2023 1825   UROBILINOGEN 0.2 01/18/2015 1100   NITRITE NEGATIVE 07/11/2023 1825   LEUKOCYTESUR SMALL (A) 07/11/2023 1825   Sepsis Labs: @LABRCNTIP (procalcitonin:4,lacticidven:4)  ) Recent Results (from the past 240 hour(s))  C Difficile Quick Screen w PCR reflex     Status: None   Collection Time: 07/11/23  1:26 AM   Specimen: STOOL  Result Value Ref Range Status   C Diff antigen NEGATIVE NEGATIVE Final   C Diff toxin NEGATIVE NEGATIVE Final   C Diff interpretation No C. difficile detected.  Final    Comment: Performed at [facility], [address], [place], [place] [address]  Culture, blood (Routine X 2) w Reflex to ID Panel     Status: None (Preliminary result)   Collection Time: 07/11/23 10:17 PM   Specimen: BLOOD LEFT ARM  Result Value Ref Range Status   Specimen Description   Final    BLOOD LEFT ARM Performed at [facility], [address], [place], [place] [address]    Special Requests   Final    BOTTLES DRAWN AEROBIC ONLY Blood Culture adequate volume Performed at [facility], [address] [place], [place] [address]    Culture   Final    NO GROWTH 3 DAYS Performed at [facility], [address], [place], [place] [address]    Report Status PENDING  Incomplete  Culture, blood (Routine X 2) w Reflex to ID Panel     Status: None (Preliminary result)   Collection Time: 07/11/23 10:17 PM   Specimen: BLOOD RIGHT ARM  Result Value Ref Range Status   Specimen Description   Final    BLOOD RIGHT ARM Performed at [facility], [address], [place], [place] [address]    Special Requests   Final    BOTTLES DRAWN AEROBIC ONLY Blood Culture adequate volume Performed at [facility], [address] [place], [place] [address]    Culture   Final    NO GROWTH 3 DAYS Performed at [facility], [address] [place], [place] [address]    Report Status PENDING  Incomplete  Calprotectin, Fecal     Status: None   Collection Time: 07/11/23 10:17 PM   Specimen: Stool  Result Value Ref Range Status   Calprotectin, Fecal WRORD ug/g Final    Comment: (NOTE) Test not performed. The required specimen for the test ordered was not received. Notified Shauna Hugh. No stool received. Concentration     Interpretation   Follow-Up < 5 - 50 ug/g     Normal           None >50 -120 ug/g     Borderline       Re-evaluate in 4-6 weeks    >120 ug/g     Abnormal         Repeat as clinically                                   indicated Performed At: [facility] [address] [place], [place] [id number] Jolene Schimke MD WG:[id number]       Studies: No results found.  Scheduled Meds:  hydrOXYzine  50 mg Oral q morning   lidocaine  1 patch Transdermal Q24H   nystatin cream   Topical BID   pantoprazole (PROTONIX) IV  40 mg Intravenous Q12H   potassium chloride SA  20 mEq Oral Daily    Continuous Infusions:  ceFEPime (MAXIPIME) IV 2 g (07/14/23 1339)   metronidazole 500 mg (07/14/23 1426)     LOS: 3 days     Darlin Drop, MD [facility] Pager [phone]  If 7PM-7AM, please contact night-coverage [email or web address] Password [email or web address] 07/14/2023, 3:28 PM

## 2023-07-14 NOTE — Plan of Care (Signed)
  Problem: Health Behavior/Discharge Planning: Goal: Ability to manage health-related needs will improve Outcome: Progressing   Problem: Coping: Goal: Level of anxiety will decrease Outcome: Progressing   Problem: Elimination: Goal: Will not experience complications related to bowel motility Outcome: Progressing   Problem: Safety: Goal: Ability to remain free from injury will improve Outcome: Progressing   Problem: Skin Integrity: Goal: Risk for impaired skin integrity will decrease Outcome: Progressing

## 2023-07-15 DIAGNOSIS — E876 Hypokalemia: Secondary | ICD-10-CM | POA: Diagnosis not present

## 2023-07-15 DIAGNOSIS — N179 Acute kidney failure, unspecified: Secondary | ICD-10-CM | POA: Diagnosis not present

## 2023-07-15 DIAGNOSIS — K529 Noninfective gastroenteritis and colitis, unspecified: Secondary | ICD-10-CM | POA: Diagnosis not present

## 2023-07-15 LAB — BPAM RBC
Blood Product Expiration Date: 202409062359
Blood Product Expiration Date: 202409062359
Unit Type and Rh: 5100
Unit Type and Rh: 5100

## 2023-07-15 LAB — TYPE AND SCREEN
ABO/RH(D): O POS
Antibody Screen: NEGATIVE
Unit division: 0
Unit division: 0

## 2023-07-15 MED ORDER — AMOXICILLIN-POT CLAVULANATE 875-125 MG PO TABS: 1.0000 | ORAL_TABLET | Freq: Two times a day (BID) | ORAL | 0 refills | Status: AC

## 2023-07-15 MED ORDER — POTASSIUM CHLORIDE CRYS ER 20 MEQ PO TBCR: 20.0000 meq | EXTENDED_RELEASE_TABLET | Freq: Every day | ORAL | Status: AC

## 2023-07-15 MED ORDER — NYSTATIN 100000 UNIT/GM EX CREA: TOPICAL_CREAM | Freq: Two times a day (BID) | CUTANEOUS | 0 refills | Status: AC

## 2023-07-15 MED ORDER — AMOXICILLIN-POT CLAVULANATE 875-125 MG PO TABS
1.0000 | ORAL_TABLET | Freq: Two times a day (BID) | ORAL | Status: DC
  Administered 2023-07-15: 1 via ORAL
  Filled 2023-07-15: qty 1

## 2023-07-15 NOTE — Discharge Summary (Signed)
Physician Discharge Summary  [name] Aguilar AOZ:[id number] DOB: [birth year] DOA: 07/11/2023  PCP: [name], MD  Admit date: 07/11/2023 Discharge date: 07/15/2023  Admitted From: Home Disposition:  Home   Recommendations for Outpatient Follow-up:  Follow up with GI in 1-2 weeks Please obtain BMP in one week  Home Health:no Equipment/Devices:None  Discharge Condition:Stable CODE STATUS:Full Diet recommendation: Heart Healthy   Brief/Interim Summary: 72 y.o. female past medical history significant for hypertension, hyperlipidemia, peripheral neuropathy and hypothyroidism comes in to med [facility] for 4 episodes of bright red blood per rectum with clots that started on the day of admission associated with abdominal cramping, last colonoscopy was 10 years ago and was unremarkable patient takes baby aspirin and Voltaren on and off for osteoarthritis, CT scan of the abdomen pelvis showed colitis from the splenic flexure to sigmoid started empirically on antibiotics cefepime and Flagyl.  C. difficile PCR was negative, GI was consulted   Discharge Diagnoses:  Principal Problem:   Colitis Active Problems:   Acute lower GI bleeding   Chronic hypokalemia   Hypomagnesemia   BRBPR (bright red blood per rectum)   AKI (acute kidney injury) (HCC)   Essential hypertension   Hyperlipidemia   Peripheral neuropathy   Hypothyroidism   SIRS (systemic inflammatory response syndrome) (HCC)   GAD (generalized anxiety disorder)  Probable infectious colitis leading to acute lower GI bleed: C. difficile PCR negative will start empirically on cefepime and Flagyl. She defervesced leukocytosis improved LFTs normalized. GI was consulted who agreed with current management. Recommended to transition to Augmentin and continue as an outpatient. She will need to follow-up with them as an outpatient and colonoscopy once symptoms improved.  Sepsis probably due to infectious colitis: Now  resolved  GERD: Continue PPI.  Chronic hypokalemia: Potassium now improved continue potassium daily at home.  Hypomagnesemia: Likely due to diarrhea repleted now resolved.  Essential hypertension: No changes made to her medication.  Acute kidney injury: Review prerenal azotemia now resolved.  Hyperlipidemia: Not on any statins will need to follow-up with PCP as an outpatient.  History of peripheral neuropathy: Limited, she is not taking gabapentin anymore.  Lower abdominal rash likely fungal: Started on nystatin cream now improved.  Hypothyroidism: Continue Synthroid.  General Anxiety: Continue Atarax. Discharge Instructions  Discharge Instructions     Diet - low sodium heart healthy   Complete by: As directed    Increase activity slowly   Complete by: As directed       Allergies as of 07/15/2023   No Known Allergies      Medication List     STOP taking these medications    DSS 100 MG Caps   methocarbamol 500 MG tablet Commonly known as: ROBAXIN       TAKE these medications    amLODipine 10 MG tablet Commonly known as: NORVASC Take 10 mg by mouth daily.   amoxicillin-clavulanate 875-125 MG tablet Commonly known as: AUGMENTIN Take 1 tablet by mouth every 12 (twelve) hours for 6 days.   diclofenac 75 MG EC tablet Commonly known as: VOLTAREN Take 75 mg by mouth daily.   hydrOXYzine 50 MG tablet Commonly known as: ATARAX Take 50 mg by mouth every morning.   losartan 100 MG tablet Commonly known as: COZAAR Take 100 mg by mouth daily.   nystatin cream Commonly known as: MYCOSTATIN Apply topically 2 (two) times daily. Applied to abdominal area   omeprazole 20 MG capsule Commonly known as: PRILOSEC TAKE 1 CAPSULE BY MOUTH ONCE  DAILY BEFORE A MEAL   oxyCODONE 5 MG immediate release tablet Commonly known as: Oxy IR/ROXICODONE Take 1-3 tablets (5-15 mg total) by mouth every 4 (four) hours as needed for severe pain.   potassium chloride  SA 20 MEQ tablet Commonly known as: KLOR-CON M Take 1 tablet (20 mEq total) by mouth daily.        No Known Allergies  Consultations: Gastroente neurology   Procedures/Studies: DG Knee 1-2 Views Left  Result Date: 07/12/2023 CLINICAL DATA:  Left knee pain. Fell and twisted left knee about a month ago. Prior left knee arthroplasty in 2015. EXAM: LEFT KNEE - 1-2 VIEW COMPARISON:  Left knee radiographs 11/08/2019 FINDINGS: Postsurgical changes are again seen of total left knee arthroplasty. There is again marked superior positioning of the patella. No perihardware lucency is seen to indicate hardware failure or loosening. No periprosthetic fracture is seen. No joint effusion. Moderate atherosclerotic calcifications. IMPRESSION: Postsurgical changes of total left knee arthroplasty. No evidence of hardware failure or loosening. Unchanged chronic high-grade superior positioning of the patella. Question chronic patellar tendon insufficiency. Electronically Signed   By: Neita Garnet M.D.   On: 07/12/2023 17:19   CT ABDOMEN PELVIS W CONTRAST  Result Date: 07/11/2023 CLINICAL DATA:  Abdominal pain, acute, nonlocalized colitis EXAM: CT ABDOMEN AND PELVIS WITH CONTRAST TECHNIQUE: Multidetector CT imaging of the abdomen and pelvis was performed using the standard protocol following bolus administration of intravenous contrast. RADIATION DOSE REDUCTION: This exam was performed according to the departmental dose-optimization program which includes automated exposure control, adjustment of the mA and/or kV according to patient size and/or use of iterative reconstruction technique. CONTRAST:  OMNIPAQUE IOHEXOL 300 MG/ML  SOLN COMPARISON:  Remote CT 06/13/2007 FINDINGS: Lower chest: No basilar airspace disease or pleural effusion. Hepatobiliary: 9 mm subcapsular hypodensity in the right hepatic lobe, too small to characterize. No further follow-up imaging is recommended. No suspicious liver lesion. The right  dome of the liver is included on delayed phase. Partially distended gallbladder without gallstone, pericholecystic inflammation or biliary dilatation. There is a Phrygian cap. Pancreas: Unremarkable. No pancreatic ductal dilatation or surrounding inflammatory changes. Spleen: Normal in size without focal abnormality. Adrenals/Urinary Tract: Normal adrenal glands. No hydronephrosis or perinephric edema. Homogeneous renal enhancement with symmetric excretion on delayed phase imaging. No renal calculi or focal renal abnormality. The urinary bladder is near completely empty, not well assessed. Stomach/Bowel: Small hiatal hernia. The stomach is otherwise unremarkable. There is colonic wall thickening with pericolonic edema extending from the splenic flexure of the colon through the sigmoid. No bowel pneumatosis or perforation. No focal diverticula to suggest diverticulitis. Diminutive normal appendix visualized. No small bowel obstruction or inflammation. Vascular/Lymphatic: Normal caliber abdominal aorta. No acute vascular findings. No abdominopelvic adenopathy. Scattered small central mesenteric lymph nodes, not enlarged by size criteria, likely reactive. Reproductive: Hysterectomy. The previous right pelvic dermoid has been resected. No adnexal mass. Other: No free air, free fluid, or intra-abdominal fluid collection. No abdominal wall hernia. Musculoskeletal: Diffuse lumbar facet hypertrophy. There are no acute or suspicious osseous abnormalities. IMPRESSION: 1. Colitis extending from the splenic flexure of the colon through the sigmoid, likely infectious or inflammatory. 2. Small hiatal hernia. Electronically Signed   By: Narda Rutherford M.D.   On: 07/11/2023 17:53   (Echo, Carotid, EGD, Colonoscopy, ERCP)    Subjective: No complaints  Discharge Exam: Vitals:   07/14/23 2007 07/15/23 0521  BP: 126/77 (!) 143/81  Pulse: 66 60  Resp:    Temp: 98.9 F (  37.2 C) 98.1 F (36.7 C)  SpO2: 96% 96%    Vitals:   07/14/23 1208 07/14/23 1721 07/14/23 2007 07/15/23 0521  BP: (!) 140/84 138/82 126/77 (!) 143/81  Pulse: 72  66 60  Resp: 17     Temp: 98.3 F (36.8 C)  98.9 F (37.2 C) 98.1 F (36.7 C)  TempSrc: Oral  Oral Oral  SpO2: 96%  96% 96%  Weight:    76.1 kg  Height:        General: Pt is alert, awake, not in acute distress Cardiovascular: RRR, S1/S2 +, no rubs, no gallops Respiratory: CTA bilaterally, no wheezing, no rhonchi Abdominal: Soft, NT, ND, bowel sounds + Extremities: no edema, no cyanosis    The results of significant diagnostics from this hospitalization (including imaging, microbiology, ancillary and laboratory) are listed below for reference.     Microbiology: Recent Results (from the past 240 hour(s))  C Difficile Quick Screen w PCR reflex     Status: None   Collection Time: 07/11/23  1:26 AM   Specimen: STOOL  Result Value Ref Range Status   C Diff antigen NEGATIVE NEGATIVE Final   C Diff toxin NEGATIVE NEGATIVE Final   C Diff interpretation No C. difficile detected.  Final    Comment: Performed at [facility], [address], [place], [place] [address]  Culture, blood (Routine X 2) w Reflex to ID Panel     Status: None (Preliminary result)   Collection Time: 07/11/23 10:17 PM   Specimen: BLOOD LEFT ARM  Result Value Ref Range Status   Specimen Description   Final    BLOOD LEFT ARM Performed at [facility], [address], [place], [place] [address]    Special Requests   Final    BOTTLES DRAWN AEROBIC ONLY Blood Culture adequate volume Performed at [facility], [address] [place], [place] [address]    Culture   Final    NO GROWTH 4 DAYS Performed at [facility], [address], [place], [place] [address]    Report Status PENDING  Incomplete  Culture, blood (Routine X 2) w Reflex to ID Panel     Status: None (Preliminary result)   Collection Time: 07/11/23 10:17 PM   Specimen:  BLOOD RIGHT ARM  Result Value Ref Range Status   Specimen Description   Final    BLOOD RIGHT ARM Performed at [facility], [address] [place], [place] [address]    Special Requests   Final    BOTTLES DRAWN AEROBIC ONLY Blood Culture adequate volume Performed at [facility], [address], [place], [place] [address]    Culture   Final    NO GROWTH 4 DAYS Performed at [facility], [address] [place], [place] [address]    Report Status PENDING  Incomplete  Calprotectin, Fecal     Status: None   Collection Time: 07/11/23 10:17 PM   Specimen: Stool  Result Value Ref Range Status   Calprotectin, Fecal WRORD ug/g Final    Comment: (NOTE) Test not performed. The required specimen for the test ordered was not received. Notified Shauna Hugh. No stool received. Concentration     Interpretation   Follow-Up < 5 - 50 ug/g     Normal           None >50 -120 ug/g     Borderline       Re-evaluate in 4-6  weeks    >120 ug/g     Abnormal         Repeat as clinically                                   indicated Performed At: [facility] [address] [place], [place] [id number] Jolene Schimke MD NW:[id number]   Gastrointestinal Panel by PCR , Stool     Status: None   Collection Time: 07/14/23  9:13 AM   Specimen: Stool  Result Value Ref Range Status   Campylobacter species NOT DETECTED NOT DETECTED Final   Plesimonas shigelloides NOT DETECTED NOT DETECTED Final   Salmonella species NOT DETECTED NOT DETECTED Final   Yersinia enterocolitica NOT DETECTED NOT DETECTED Final   Vibrio species NOT DETECTED NOT DETECTED Final   Vibrio cholerae NOT DETECTED NOT DETECTED Final   Enteroaggregative E coli (EAEC) NOT DETECTED NOT DETECTED Final   Enteropathogenic E coli (EPEC) NOT DETECTED NOT DETECTED Final   Enterotoxigenic E coli (ETEC) NOT DETECTED NOT DETECTED Final   Shiga like toxin producing E coli (STEC) NOT DETECTED NOT DETECTED Final    Shigella/Enteroinvasive E coli (EIEC) NOT DETECTED NOT DETECTED Final   Cryptosporidium NOT DETECTED NOT DETECTED Final   Cyclospora cayetanensis NOT DETECTED NOT DETECTED Final   Entamoeba histolytica NOT DETECTED NOT DETECTED Final   Giardia lamblia NOT DETECTED NOT DETECTED Final   Adenovirus F40/41 NOT DETECTED NOT DETECTED Final   Astrovirus NOT DETECTED NOT DETECTED Final   Norovirus GI/GII NOT DETECTED NOT DETECTED Final   Rotavirus A NOT DETECTED NOT DETECTED Final   Sapovirus (I, II, IV, and V) NOT DETECTED NOT DETECTED Final    Comment: Performed at [facility], [address] [place], [place] [address]     Labs: BNP (last 3 results) No results for input(s): "BNP" in the last 8760 hours. Basic Metabolic Panel: Recent Labs  Lab 07/11/23 1535 07/11/23 1537 07/11/23 2140 07/12/23 0502 07/13/23 0352 07/14/23 0404 07/15/23 0402  NA  --    < > 136 137 136 138 134*  K  --    < > 2.8* 3.3* 3.7 3.7 3.7  CL  --    < > 99 101 102 103 99  CO2  --    < > 24 24 28 25 24   GLUCOSE  --    < > 103* 119* 89 82 91  BUN  --    < > 10 9 6* 6* 10  CREATININE  --    < > 0.89 0.79 0.80 0.79 0.81  CALCIUM  --    < > 9.1 9.4 9.0 9.6 9.3  MG 1.6*  --   --  1.8  --   --   --    < > = values in this interval not displayed.   Liver Function Tests: Recent Labs  Lab 07/11/23 2140 07/12/23 0502 07/13/23 0352 07/14/23 0404 07/15/23 0402  AST 21 21 14* 14* 13*  ALT 14 14 12 11 11   ALKPHOS 85 79 65 69 71  BILITOT 0.7 0.3 0.5 0.6 0.6  PROT 7.8 7.7 6.7 6.9 7.2  ALBUMIN 3.6 3.5 3.0* 3.0* 3.3*   No results for input(s): "LIPASE", "AMYLASE" in the last 168 hours. No results for input(s): "AMMONIA" in the last 168 hours. CBC: Recent Labs  Lab 07/11/23 1537 07/11/23 2140 07/12/23 0502 07/12/23 0503 07/13/23 0352 07/13/23 1534 07/13/23  2118 07/14/23 0404 07/14/23 0848 07/15/23 0402  WBC 14.8*  --  13.7*  --  12.2*  --   --  8.1  --  7.8  NEUTROABS  --   --  10.7*  --   8.6*  --   --  4.8  --  4.7  HGB 16.8*   < > 15.5*   < > 13.6 14.8 13.9 14.0 14.2 14.6  HCT 48.7*   < > 47.3*   < > 41.8 46.8* 42.5 42.7 43.4 44.4  MCV 87.0  --  92.4  --  91.9  --   --  91.8  --  90.1  PLT 329  --  299  --  260  --   --  266  --  276   < > = values in this interval not displayed.   Cardiac Enzymes: Recent Labs  Lab 07/11/23 1535  CKTOTAL 180   BNP: Invalid input(s): "POCBNP" CBG: No results for input(s): "GLUCAP" in the last 168 hours. D-Dimer No results for input(s): "DDIMER" in the last 72 hours. Hgb A1c No results for input(s): "HGBA1C" in the last 72 hours. Lipid Profile No results for input(s): "CHOL", "HDL", "LDLCALC", "TRIG", "CHOLHDL", "LDLDIRECT" in the last 72 hours. Thyroid function studies No results for input(s): "TSH", "T4TOTAL", "T3FREE", "THYROIDAB" in the last 72 hours.  Invalid input(s): "FREET3" Anemia work up No results for input(s): "VITAMINB12", "FOLATE", "FERRITIN", "TIBC", "IRON", "RETICCTPCT" in the last 72 hours. Urinalysis    Component Value Date/Time   COLORURINE YELLOW 07/11/2023 1825   APPEARANCEUR CLEAR 07/11/2023 1825   LABSPEC 1.010 07/11/2023 1825   PHURINE 7.0 07/11/2023 1825   GLUCOSEU NEGATIVE 07/11/2023 1825   HGBUR MODERATE (A) 07/11/2023 1825   BILIRUBINUR NEGATIVE 07/11/2023 1825   KETONESUR NEGATIVE 07/11/2023 1825   PROTEINUR NEGATIVE 07/11/2023 1825   UROBILINOGEN 0.2 01/18/2015 1100   NITRITE NEGATIVE 07/11/2023 1825   LEUKOCYTESUR SMALL (A) 07/11/2023 1825   Sepsis Labs Recent Labs  Lab 07/12/23 0502 07/13/23 0352 07/14/23 0404 07/15/23 0402  WBC 13.7* 12.2* 8.1 7.8   Microbiology Recent Results (from the past 240 hour(s))  C Difficile Quick Screen w PCR reflex     Status: None   Collection Time: 07/11/23  1:26 AM   Specimen: STOOL  Result Value Ref Range Status   C Diff antigen NEGATIVE NEGATIVE Final   C Diff toxin NEGATIVE NEGATIVE Final   C Diff interpretation No C. difficile detected.   Final    Comment: Performed at [facility], [address] [place], [place] [address]  Culture, blood (Routine X 2) w Reflex to ID Panel     Status: None (Preliminary result)   Collection Time: 07/11/23 10:17 PM   Specimen: BLOOD LEFT ARM  Result Value Ref Range Status   Specimen Description   Final    BLOOD LEFT ARM Performed at [facility], [address] [place], [place] [address]    Special Requests   Final    BOTTLES DRAWN AEROBIC ONLY Blood Culture adequate volume Performed at [facility], [address] [place], [place] [address]    Culture   Final    NO GROWTH 4 DAYS Performed at [facility], [address] [place], [place] [address]    Report Status PENDING  Incomplete  Culture, blood (Routine X 2) w Reflex to ID Panel     Status: None (Preliminary result)   Collection Time: 07/11/23 10:17 PM  Specimen: BLOOD RIGHT ARM  Result Value Ref Range Status   Specimen Description   Final    BLOOD RIGHT ARM Performed at [facility], [address], [place], [place] [address]    Special Requests   Final    BOTTLES DRAWN AEROBIC ONLY Blood Culture adequate volume Performed at [facility], [address], [place], [place] [address]    Culture   Final    NO GROWTH 4 DAYS Performed at [facility], [address] [place], [place] [address]    Report Status PENDING  Incomplete  Calprotectin, Fecal     Status: None   Collection Time: 07/11/23 10:17 PM   Specimen: Stool  Result Value Ref Range Status   Calprotectin, Fecal WRORD ug/g Final    Comment: (NOTE) Test not performed. The required specimen for the test ordered was not received. Notified Shauna Hugh. No stool received. Concentration     Interpretation   Follow-Up < 5 - 50 ug/g     Normal           None >50 -120 ug/g     Borderline       Re-evaluate in 4-6 weeks    >120 ug/g     Abnormal         Repeat as clinically                                    indicated Performed At: [facility] [address] [place], [place] [id number] Jolene Schimke MD OZ:[id number]   Gastrointestinal Panel by PCR , Stool     Status: None   Collection Time: 07/14/23  9:13 AM   Specimen: Stool  Result Value Ref Range Status   Campylobacter species NOT DETECTED NOT DETECTED Final   Plesimonas shigelloides NOT DETECTED NOT DETECTED Final   Salmonella species NOT DETECTED NOT DETECTED Final   Yersinia enterocolitica NOT DETECTED NOT DETECTED Final   Vibrio species NOT DETECTED NOT DETECTED Final   Vibrio cholerae NOT DETECTED NOT DETECTED Final   Enteroaggregative E coli (EAEC) NOT DETECTED NOT DETECTED Final   Enteropathogenic E coli (EPEC) NOT DETECTED NOT DETECTED Final   Enterotoxigenic E coli (ETEC) NOT DETECTED NOT DETECTED Final   Shiga like toxin producing E coli (STEC) NOT DETECTED NOT DETECTED Final   Shigella/Enteroinvasive E coli (EIEC) NOT DETECTED NOT DETECTED Final   Cryptosporidium NOT DETECTED NOT DETECTED Final   Cyclospora cayetanensis NOT DETECTED NOT DETECTED Final   Entamoeba histolytica NOT DETECTED NOT DETECTED Final   Giardia lamblia NOT DETECTED NOT DETECTED Final   Adenovirus F40/41 NOT DETECTED NOT DETECTED Final   Astrovirus NOT DETECTED NOT DETECTED Final   Norovirus GI/GII NOT DETECTED NOT DETECTED Final   Rotavirus A NOT DETECTED NOT DETECTED Final   Sapovirus (I, II, IV, and V) NOT DETECTED NOT DETECTED Final    Comment: Performed at [facility], [address] [place], [place] [address]     SIGNED:   Marinda Elk, MD  [facility] 07/15/2023, 9:06 AM Pager   If 7PM-7AM, please contact night-coverage [email or web address] Password [email or web address]

## 2023-07-15 NOTE — Plan of Care (Signed)

## 2023-07-15 NOTE — Progress Notes (Signed)
[facility] Progress Note  [name] Aguilar 72 y.o. May 23, 1951  CC: Abdominal pain, rectal bleeding, diarrhea   Subjective: Patient seen and examined at bedside.  Nominal pain improved.  Diarrhea improved.  Planning for discharge today.  ROS : Afebrile, negative for chest pain   Objective: Vital signs in last 24 hours: Vitals:   07/15/23 0521 07/15/23 1011  BP: (!) 143/81 127/77  Pulse: 60   Resp:    Temp: 98.1 F (36.7 C)   SpO2: 96%     Physical Exam: Elderly patient, resting comfortably.  Not in acute distress.  Abdominal exam benign.   Lab Results: Recent Labs    07/14/23 0404 07/15/23 0402  NA 138 134*  K 3.7 3.7  CL 103 99  CO2 25 24  GLUCOSE 82 91  BUN 6* 10  CREATININE 0.79 0.81  CALCIUM 9.6 9.3   Recent Labs    07/14/23 0404 07/15/23 0402  AST 14* 13*  ALT 11 11  ALKPHOS 69 71  BILITOT 0.6 0.6  PROT 6.9 7.2  ALBUMIN 3.0* 3.3*   Recent Labs    07/14/23 0404 07/14/23 0848 07/15/23 0402  WBC 8.1  --  7.8  NEUTROABS 4.8  --  4.7  HGB 14.0 14.2 14.6  HCT 42.7 43.4 44.4  MCV 91.8  --  90.1  PLT 266  --  276   No results for input(s): "LABPROT", "INR" in the last 72 hours.     Assessment/Plan: -Abdominal pain.  CT scan concerning for colitis extending from splenic flexure to the sigmoid colon.  Could be ischemic colitis based on distribution. -Diarrhea.  Improving.  C. difficile negative. -Rectal bleeding.  Improving.  Recommendations -------------------------- -GI pathogen panel negative.  No further bleeding.  Normal CBC and LFTs now. -Okay to discharge from GI standpoint on oral antibiotics. -Follow up in GI clinic in 2 to 3 months to discuss outpatient colonoscopy for follow-up on colitis or bleeding.   Kathi Der MD, FACP 07/15/2023, 11:41 AM  Contact #  [phone]

## 2023-07-16 LAB — CULTURE, BLOOD (ROUTINE X 2)
Culture: NO GROWTH
Culture: NO GROWTH
Special Requests: ADEQUATE
Special Requests: ADEQUATE

## 2023-07-23 MED FILL — Fentanyl Citrate Preservative Free (PF) Inj 100 MCG/2ML: INTRAMUSCULAR | Qty: 1 | Status: AC

## 2024-10-27 ENCOUNTER — Institutional Professional Consult (permissible substitution) (HOSPITAL_COMMUNITY)

## 2024-10-27 ENCOUNTER — Inpatient Hospital Stay
Admission: RE | Admit: 2024-10-27 | Discharge: 2024-11-25 | Disposition: A | Discharge status: Skilled Nursing Facility | Source: Other Acute Inpatient Hospital | Attending: Internal Medicine | Admitting: Internal Medicine

## 2024-10-27 LAB — BLOOD GAS, ARTERIAL
Acid-Base Excess: 11.4 mmol/L — ABNORMAL HIGH (ref 0.0–2.0)
Bicarbonate: 35.1 mmol/L — ABNORMAL HIGH (ref 20.0–28.0)
O2 Saturation: 98.4 %
Patient temperature: 37.5
pCO2 arterial: 42 mmHg (ref 32–48)
pH, Arterial: 7.53 — ABNORMAL HIGH (ref 7.35–7.45)
pO2, Arterial: 120 mmHg — ABNORMAL HIGH (ref 83–108)

## 2024-10-27 MED ORDER — DIATRIZOATE MEGLUMINE & SODIUM 66-10 % PO SOLN
30.0000 mL | Freq: Once | ORAL | Status: AC
  Administered 2024-10-27: 25 mL

## 2024-10-28 DIAGNOSIS — I5020 Unspecified systolic (congestive) heart failure: Secondary | ICD-10-CM

## 2024-10-28 DIAGNOSIS — J9621 Acute and chronic respiratory failure with hypoxia: Secondary | ICD-10-CM

## 2024-10-28 DIAGNOSIS — J850 Gangrene and necrosis of lung: Secondary | ICD-10-CM

## 2024-10-28 DIAGNOSIS — G931 Anoxic brain damage, not elsewhere classified: Secondary | ICD-10-CM

## 2024-10-28 DIAGNOSIS — Z930 Tracheostomy status: Secondary | ICD-10-CM

## 2024-10-28 LAB — CBC WITH DIFFERENTIAL/PLATELET
Abs Immature Granulocytes: 0.1 K/uL — ABNORMAL HIGH (ref 0.00–0.07)
Basophils Absolute: 0.1 K/uL (ref 0.0–0.1)
Basophils Relative: 1 %
Eosinophils Absolute: 1 K/uL — ABNORMAL HIGH (ref 0.0–0.5)
Eosinophils Relative: 7 %
HCT: 25.5 % — ABNORMAL LOW (ref 36.0–46.0)
Hemoglobin: 8.4 g/dL — ABNORMAL LOW (ref 12.0–15.0)
Immature Granulocytes: 1 %
Lymphocytes Relative: 12 %
Lymphs Abs: 1.8 K/uL (ref 0.7–4.0)
MCH: 31.3 pg (ref 26.0–34.0)
MCHC: 32.9 g/dL (ref 30.0–36.0)
MCV: 95.1 fL (ref 80.0–100.0)
Monocytes Absolute: 1.7 K/uL — ABNORMAL HIGH (ref 0.1–1.0)
Monocytes Relative: 11 %
Neutro Abs: 10.6 K/uL — ABNORMAL HIGH (ref 1.7–7.7)
Neutrophils Relative %: 68 %
Platelets: 472 K/uL — ABNORMAL HIGH (ref 150–400)
RBC: 2.68 MIL/uL — ABNORMAL LOW (ref 3.87–5.11)
RDW: 14.9 % (ref 11.5–15.5)
WBC: 15.3 K/uL — ABNORMAL HIGH (ref 4.0–10.5)
nRBC: 0 % (ref 0.0–0.2)

## 2024-10-28 LAB — TSH: TSH: 6.682 u[IU]/mL — ABNORMAL HIGH (ref 0.350–4.500)

## 2024-10-28 LAB — COMPREHENSIVE METABOLIC PANEL WITH GFR
ALT: 7 U/L (ref 0–44)
AST: 53 U/L — ABNORMAL HIGH (ref 15–41)
Albumin: <1.5 g/dL — ABNORMAL LOW (ref 3.5–5.0)
Alkaline Phosphatase: 82 U/L (ref 38–126)
Anion gap: 9 (ref 5–15)
BUN: 20 mg/dL (ref 8–23)
CO2: 29 mmol/L (ref 22–32)
Calcium: 8.3 mg/dL — ABNORMAL LOW (ref 8.9–10.3)
Chloride: 96 mmol/L — ABNORMAL LOW (ref 98–111)
Creatinine, Ser: 0.51 mg/dL (ref 0.44–1.00)
GFR, Estimated: >60 mL/min (ref >60)
Glucose, Bld: 121 mg/dL — ABNORMAL HIGH (ref 70–99)
Potassium: 3.9 mmol/L (ref 3.5–5.1)
Sodium: 134 mmol/L — ABNORMAL LOW (ref 135–145)
Total Bilirubin: 0.3 mg/dL (ref 0.0–1.2)
Total Protein: 7.8 g/dL (ref 6.5–8.1)

## 2024-10-28 LAB — HEMOGLOBIN A1C
Hgb A1c MFr Bld: 6 % — ABNORMAL HIGH (ref 4.8–5.6)
Mean Plasma Glucose: 125.5 mg/dL

## 2024-10-28 LAB — C DIFFICILE QUICK SCREEN W PCR REFLEX
C Diff antigen: NEGATIVE
C Diff interpretation: NOT DETECTED
C Diff toxin: NEGATIVE

## 2024-10-30 LAB — BASIC METABOLIC PANEL WITH GFR
Anion gap: 8 (ref 5–15)
BUN: 15 mg/dL (ref 8–23)
CO2: 34 mmol/L — ABNORMAL HIGH (ref 22–32)
Calcium: 8.3 mg/dL — ABNORMAL LOW (ref 8.9–10.3)
Chloride: 95 mmol/L — ABNORMAL LOW (ref 98–111)
Creatinine, Ser: 0.62 mg/dL (ref 0.44–1.00)
GFR, Estimated: >60 mL/min (ref >60)
Glucose, Bld: 125 mg/dL — ABNORMAL HIGH (ref 70–99)
Potassium: 3.7 mmol/L (ref 3.5–5.1)
Sodium: 137 mmol/L (ref 135–145)

## 2024-10-30 LAB — CBC
HCT: 24 % — ABNORMAL LOW (ref 36.0–46.0)
Hemoglobin: 7.6 g/dL — ABNORMAL LOW (ref 12.0–15.0)
MCH: 30.3 pg (ref 26.0–34.0)
MCHC: 31.7 g/dL (ref 30.0–36.0)
MCV: 95.6 fL (ref 80.0–100.0)
Platelets: 386 K/uL (ref 150–400)
RBC: 2.51 MIL/uL — ABNORMAL LOW (ref 3.87–5.11)
RDW: 14.5 % (ref 11.5–15.5)
WBC: 13.4 K/uL — ABNORMAL HIGH (ref 4.0–10.5)
nRBC: 0 % (ref 0.0–0.2)

## 2024-10-31 LAB — CBC
HCT: 24.4 % — ABNORMAL LOW (ref 36.0–46.0)
Hemoglobin: 7.9 g/dL — ABNORMAL LOW (ref 12.0–15.0)
MCH: 30.9 pg (ref 26.0–34.0)
MCHC: 32.4 g/dL (ref 30.0–36.0)
MCV: 95.3 fL (ref 80.0–100.0)
Platelets: 348 K/uL (ref 150–400)
RBC: 2.56 MIL/uL — ABNORMAL LOW (ref 3.87–5.11)
RDW: 14.6 % (ref 11.5–15.5)
WBC: 14.2 K/uL — ABNORMAL HIGH (ref 4.0–10.5)
nRBC: 0 % (ref 0.0–0.2)

## 2024-11-02 LAB — BASIC METABOLIC PANEL WITH GFR
Anion gap: 9 (ref 5–15)
BUN: 18 mg/dL (ref 8–23)
CO2: 31 mmol/L (ref 22–32)
Calcium: 8.3 mg/dL — ABNORMAL LOW (ref 8.9–10.3)
Chloride: 96 mmol/L — ABNORMAL LOW (ref 98–111)
Creatinine, Ser: 0.6 mg/dL (ref 0.44–1.00)
GFR, Estimated: >60 mL/min (ref >60)
Glucose, Bld: 110 mg/dL — ABNORMAL HIGH (ref 70–99)
Potassium: 3.9 mmol/L (ref 3.5–5.1)
Sodium: 136 mmol/L (ref 135–145)

## 2024-11-02 LAB — CBC
HCT: 24.2 % — ABNORMAL LOW (ref 36.0–46.0)
Hemoglobin: 7.7 g/dL — ABNORMAL LOW (ref 12.0–15.0)
MCH: 30.3 pg (ref 26.0–34.0)
MCHC: 31.8 g/dL (ref 30.0–36.0)
MCV: 95.3 fL (ref 80.0–100.0)
Platelets: 224 K/uL (ref 150–400)
RBC: 2.54 MIL/uL — ABNORMAL LOW (ref 3.87–5.11)
RDW: 14.6 % (ref 11.5–15.5)
WBC: 11.8 K/uL — ABNORMAL HIGH (ref 4.0–10.5)
nRBC: 0 % (ref 0.0–0.2)

## 2024-11-02 LAB — MAGNESIUM: Magnesium: 1.8 mg/dL (ref 1.7–2.4)

## 2024-11-06 LAB — CBC WITH DIFFERENTIAL/PLATELET
Abs Immature Granulocytes: 0.05 K/uL (ref 0.00–0.07)
Basophils Absolute: 0.1 K/uL (ref 0.0–0.1)
Basophils Relative: 1 %
Eosinophils Absolute: 0.4 K/uL (ref 0.0–0.5)
Eosinophils Relative: 4 %
HCT: 25.1 % — ABNORMAL LOW (ref 36.0–46.0)
Hemoglobin: 8 g/dL — ABNORMAL LOW (ref 12.0–15.0)
Immature Granulocytes: 1 %
Lymphocytes Relative: 19 %
Lymphs Abs: 2.1 K/uL (ref 0.7–4.0)
MCH: 30.5 pg (ref 26.0–34.0)
MCHC: 31.9 g/dL (ref 30.0–36.0)
MCV: 95.8 fL (ref 80.0–100.0)
Monocytes Absolute: 1.3 K/uL — ABNORMAL HIGH (ref 0.1–1.0)
Monocytes Relative: 12 %
Neutro Abs: 6.9 K/uL (ref 1.7–7.7)
Neutrophils Relative %: 63 %
Platelets: 174 K/uL (ref 150–400)
RBC: 2.62 MIL/uL — ABNORMAL LOW (ref 3.87–5.11)
RDW: 14.6 % (ref 11.5–15.5)
WBC: 10.9 K/uL — ABNORMAL HIGH (ref 4.0–10.5)
nRBC: 0 % (ref 0.0–0.2)

## 2024-11-06 LAB — COMPREHENSIVE METABOLIC PANEL WITH GFR
ALT: <5 U/L (ref 0–44)
AST: 40 U/L (ref 15–41)
Albumin: <1.5 g/dL — ABNORMAL LOW (ref 3.5–5.0)
Alkaline Phosphatase: 66 U/L (ref 38–126)
Anion gap: 11 (ref 5–15)
BUN: 20 mg/dL (ref 8–23)
CO2: 31 mmol/L (ref 22–32)
Calcium: 8.2 mg/dL — ABNORMAL LOW (ref 8.9–10.3)
Chloride: 92 mmol/L — ABNORMAL LOW (ref 98–111)
Creatinine, Ser: 0.58 mg/dL (ref 0.44–1.00)
GFR, Estimated: >60 mL/min (ref >60)
Glucose, Bld: 117 mg/dL — ABNORMAL HIGH (ref 70–99)
Potassium: 3.6 mmol/L (ref 3.5–5.1)
Sodium: 134 mmol/L — ABNORMAL LOW (ref 135–145)
Total Bilirubin: 0.2 mg/dL (ref 0.0–1.2)
Total Protein: 7.5 g/dL (ref 6.5–8.1)

## 2024-11-08 LAB — CULTURE, RESPIRATORY W GRAM STAIN

## 2024-11-09 LAB — BLOOD GAS, ARTERIAL
Acid-Base Excess: 17.7 mmol/L — ABNORMAL HIGH (ref 0.0–2.0)
Bicarbonate: 42.1 mmol/L — ABNORMAL HIGH (ref 20.0–28.0)
O2 Saturation: 99.9 %
Patient temperature: 37
pCO2 arterial: 46 mmHg (ref 32–48)
pH, Arterial: 7.57 — ABNORMAL HIGH (ref 7.35–7.45)
pO2, Arterial: 87 mmHg (ref 83–108)

## 2024-11-12 ENCOUNTER — Institutional Professional Consult (permissible substitution) (HOSPITAL_COMMUNITY)

## 2024-11-12 LAB — MAGNESIUM: Magnesium: 2.1 mg/dL (ref 1.7–2.4)

## 2024-11-12 LAB — CBC
HCT: 23.3 % — ABNORMAL LOW (ref 36.0–46.0)
Hemoglobin: 7.3 g/dL — ABNORMAL LOW (ref 12.0–15.0)
MCH: 30 pg (ref 26.0–34.0)
MCHC: 31.3 g/dL (ref 30.0–36.0)
MCV: 95.9 fL (ref 80.0–100.0)
Platelets: 215 K/uL (ref 150–400)
RBC: 2.43 MIL/uL — ABNORMAL LOW (ref 3.87–5.11)
RDW: 14.6 % (ref 11.5–15.5)
WBC: 16.2 K/uL — ABNORMAL HIGH (ref 4.0–10.5)
nRBC: 0 % (ref 0.0–0.2)

## 2024-11-12 LAB — BASIC METABOLIC PANEL WITH GFR
Anion gap: 5 (ref 5–15)
BUN: 37 mg/dL — ABNORMAL HIGH (ref 8–23)
CO2: 38 mmol/L — ABNORMAL HIGH (ref 22–32)
Calcium: 8.5 mg/dL — ABNORMAL LOW (ref 8.9–10.3)
Chloride: 94 mmol/L — ABNORMAL LOW (ref 98–111)
Creatinine, Ser: 0.73 mg/dL (ref 0.44–1.00)
GFR, Estimated: >60 mL/min (ref >60)
Glucose, Bld: 120 mg/dL — ABNORMAL HIGH (ref 70–99)
Potassium: 3.5 mmol/L (ref 3.5–5.1)
Sodium: 137 mmol/L (ref 135–145)

## 2024-11-12 MED ORDER — IOHEXOL 350 MG/ML SOLN
75.0000 mL | Freq: Once | INTRAVENOUS | Status: AC | PRN
  Administered 2024-11-12: 75 mL via INTRAVENOUS

## 2024-11-13 ENCOUNTER — Institutional Professional Consult (permissible substitution) (HOSPITAL_COMMUNITY)

## 2024-11-15 LAB — BASIC METABOLIC PANEL WITH GFR
Anion gap: 15 (ref 5–15)
BUN: 35 mg/dL — ABNORMAL HIGH (ref 8–23)
CO2: 30 mmol/L (ref 22–32)
Calcium: 8.2 mg/dL — ABNORMAL LOW (ref 8.9–10.3)
Chloride: 95 mmol/L — ABNORMAL LOW (ref 98–111)
Creatinine, Ser: 0.7 mg/dL (ref 0.44–1.00)
GFR, Estimated: >60 mL/min (ref >60)
Glucose, Bld: 142 mg/dL — ABNORMAL HIGH (ref 70–99)
Potassium: 3.5 mmol/L (ref 3.5–5.1)
Sodium: 140 mmol/L (ref 135–145)

## 2024-11-15 LAB — CBC
HCT: 24.5 % — ABNORMAL LOW (ref 36.0–46.0)
Hemoglobin: 7.7 g/dL — ABNORMAL LOW (ref 12.0–15.0)
MCH: 30.8 pg (ref 26.0–34.0)
MCHC: 31.4 g/dL (ref 30.0–36.0)
MCV: 98 fL (ref 80.0–100.0)
Platelets: 297 K/uL (ref 150–400)
RBC: 2.5 MIL/uL — ABNORMAL LOW (ref 3.87–5.11)
RDW: 14.6 % (ref 11.5–15.5)
WBC: 18 K/uL — ABNORMAL HIGH (ref 4.0–10.5)
nRBC: 0 % (ref 0.0–0.2)

## 2024-11-15 LAB — TSH: TSH: 4.38 u[IU]/mL (ref 0.350–4.500)

## 2024-11-16 ENCOUNTER — Institutional Professional Consult (permissible substitution) (HOSPITAL_COMMUNITY)

## 2024-11-17 LAB — BASIC METABOLIC PANEL WITH GFR
Anion gap: 9 (ref 5–15)
BUN: 37 mg/dL — ABNORMAL HIGH (ref 8–23)
CO2: 35 mmol/L — ABNORMAL HIGH (ref 22–32)
Calcium: 8.1 mg/dL — ABNORMAL LOW (ref 8.9–10.3)
Chloride: 98 mmol/L (ref 98–111)
Creatinine, Ser: 0.72 mg/dL (ref 0.44–1.00)
GFR, Estimated: >60 mL/min (ref >60)
Glucose, Bld: 124 mg/dL — ABNORMAL HIGH (ref 70–99)
Potassium: 3.6 mmol/L (ref 3.5–5.1)
Sodium: 142 mmol/L (ref 135–145)

## 2024-11-17 LAB — CBC
HCT: 21.9 % — ABNORMAL LOW (ref 36.0–46.0)
Hemoglobin: 6.8 g/dL — CL (ref 12.0–15.0)
MCH: 30.4 pg (ref 26.0–34.0)
MCHC: 31.1 g/dL (ref 30.0–36.0)
MCV: 97.8 fL (ref 80.0–100.0)
Platelets: 335 K/uL (ref 150–400)
RBC: 2.24 MIL/uL — ABNORMAL LOW (ref 3.87–5.11)
RDW: 14.9 % (ref 11.5–15.5)
WBC: 22.5 K/uL — ABNORMAL HIGH (ref 4.0–10.5)
nRBC: 0 % (ref 0.0–0.2)

## 2024-11-17 LAB — HEMOGLOBIN AND HEMATOCRIT, BLOOD
HCT: 26.6 % — ABNORMAL LOW (ref 36.0–46.0)
Hemoglobin: 8.6 g/dL — ABNORMAL LOW (ref 12.0–15.0)

## 2024-11-17 LAB — ABO/RH: ABO/RH(D): O POS

## 2024-11-17 LAB — PREPARE RBC (CROSSMATCH)

## 2024-11-18 LAB — CULTURE, RESPIRATORY W GRAM STAIN

## 2024-11-18 LAB — TYPE AND SCREEN
ABO/RH(D): O POS
Antibody Screen: NEGATIVE
Unit division: 0

## 2024-11-18 LAB — BPAM RBC
Blood Product Expiration Date: 202601062359
ISSUE DATE / TIME: 202512111434
Unit Type and Rh: 5100

## 2024-11-19 ENCOUNTER — Institutional Professional Consult (permissible substitution) (HOSPITAL_COMMUNITY)

## 2024-11-21 LAB — COMPREHENSIVE METABOLIC PANEL WITH GFR
ALT: 27 U/L (ref 0–44)
AST: 82 U/L — ABNORMAL HIGH (ref 15–41)
Albumin: <1.5 g/dL — ABNORMAL LOW (ref 3.5–5.0)
Alkaline Phosphatase: 72 U/L (ref 38–126)
Anion gap: 10 (ref 5–15)
BUN: 50 mg/dL — ABNORMAL HIGH (ref 8–23)
CO2: 35 mmol/L — ABNORMAL HIGH (ref 22–32)
Calcium: 8.3 mg/dL — ABNORMAL LOW (ref 8.9–10.3)
Chloride: 105 mmol/L (ref 98–111)
Creatinine, Ser: 0.75 mg/dL (ref 0.44–1.00)
GFR, Estimated: >60 mL/min (ref >60)
Glucose, Bld: 117 mg/dL — ABNORMAL HIGH (ref 70–99)
Potassium: 3.3 mmol/L — ABNORMAL LOW (ref 3.5–5.1)
Sodium: 150 mmol/L — ABNORMAL HIGH (ref 135–145)
Total Bilirubin: 0.5 mg/dL (ref 0.0–1.2)
Total Protein: 7.6 g/dL (ref 6.5–8.1)

## 2024-11-21 LAB — CBC WITH DIFFERENTIAL/PLATELET
Abs Immature Granulocytes: 0.16 K/uL — ABNORMAL HIGH (ref 0.00–0.07)
Basophils Absolute: 0 K/uL (ref 0.0–0.1)
Basophils Relative: 0 %
Eosinophils Absolute: 0.3 K/uL (ref 0.0–0.5)
Eosinophils Relative: 2 %
HCT: 26 % — ABNORMAL LOW (ref 36.0–46.0)
Hemoglobin: 8 g/dL — ABNORMAL LOW (ref 12.0–15.0)
Immature Granulocytes: 1 %
Lymphocytes Relative: 14 %
Lymphs Abs: 2.2 K/uL (ref 0.7–4.0)
MCH: 29.9 pg (ref 26.0–34.0)
MCHC: 30.8 g/dL (ref 30.0–36.0)
MCV: 97 fL (ref 80.0–100.0)
Monocytes Absolute: 1.4 K/uL — ABNORMAL HIGH (ref 0.1–1.0)
Monocytes Relative: 9 %
Neutro Abs: 11.8 K/uL — ABNORMAL HIGH (ref 1.7–7.7)
Neutrophils Relative %: 74 %
Platelets: 344 K/uL (ref 150–400)
RBC: 2.68 MIL/uL — ABNORMAL LOW (ref 3.87–5.11)
RDW: 14.9 % (ref 11.5–15.5)
WBC: 15.9 K/uL — ABNORMAL HIGH (ref 4.0–10.5)
nRBC: 0 % (ref 0.0–0.2)

## 2024-11-22 ENCOUNTER — Institutional Professional Consult (permissible substitution) (HOSPITAL_COMMUNITY)

## 2024-11-22 DIAGNOSIS — I5020 Unspecified systolic (congestive) heart failure: Secondary | ICD-10-CM | POA: Diagnosis not present

## 2024-11-22 DIAGNOSIS — J9621 Acute and chronic respiratory failure with hypoxia: Secondary | ICD-10-CM | POA: Diagnosis not present

## 2024-11-22 DIAGNOSIS — J850 Gangrene and necrosis of lung: Secondary | ICD-10-CM | POA: Diagnosis not present

## 2024-11-22 DIAGNOSIS — Z930 Tracheostomy status: Secondary | ICD-10-CM | POA: Diagnosis not present

## 2024-11-22 DIAGNOSIS — G931 Anoxic brain damage, not elsewhere classified: Secondary | ICD-10-CM | POA: Diagnosis not present

## 2024-11-22 LAB — BASIC METABOLIC PANEL WITH GFR
Anion gap: 7 (ref 5–15)
BUN: 44 mg/dL — ABNORMAL HIGH (ref 8–23)
CO2: 34 mmol/L — ABNORMAL HIGH (ref 22–32)
Calcium: 8.1 mg/dL — ABNORMAL LOW (ref 8.9–10.3)
Chloride: 106 mmol/L (ref 98–111)
Creatinine, Ser: 0.72 mg/dL (ref 0.44–1.00)
GFR, Estimated: >60 mL/min (ref >60)
Glucose, Bld: 151 mg/dL — ABNORMAL HIGH (ref 70–99)
Potassium: 3.9 mmol/L (ref 3.5–5.1)
Sodium: 147 mmol/L — ABNORMAL HIGH (ref 135–145)

## 2024-11-23 DIAGNOSIS — J850 Gangrene and necrosis of lung: Secondary | ICD-10-CM | POA: Diagnosis not present

## 2024-11-23 DIAGNOSIS — Z930 Tracheostomy status: Secondary | ICD-10-CM | POA: Diagnosis not present

## 2024-11-23 DIAGNOSIS — I5020 Unspecified systolic (congestive) heart failure: Secondary | ICD-10-CM | POA: Diagnosis not present

## 2024-11-23 DIAGNOSIS — G931 Anoxic brain damage, not elsewhere classified: Secondary | ICD-10-CM | POA: Diagnosis not present

## 2024-11-23 DIAGNOSIS — J9621 Acute and chronic respiratory failure with hypoxia: Secondary | ICD-10-CM | POA: Diagnosis not present

## 2024-11-23 LAB — BASIC METABOLIC PANEL WITH GFR
Anion gap: 8 (ref 5–15)
BUN: 41 mg/dL — ABNORMAL HIGH (ref 8–23)
CO2: 31 mmol/L (ref 22–32)
Calcium: 8.7 mg/dL — ABNORMAL LOW (ref 8.9–10.3)
Chloride: 103 mmol/L (ref 98–111)
Creatinine, Ser: 0.64 mg/dL (ref 0.44–1.00)
GFR, Estimated: >60 mL/min (ref >60)
Glucose, Bld: 140 mg/dL — ABNORMAL HIGH (ref 70–99)
Potassium: 4.4 mmol/L (ref 3.5–5.1)
Sodium: 142 mmol/L (ref 135–145)

## 2024-11-23 LAB — CBC WITH DIFFERENTIAL/PLATELET
Abs Immature Granulocytes: 0.23 K/uL — ABNORMAL HIGH (ref 0.00–0.07)
Basophils Absolute: 0.1 K/uL (ref 0.0–0.1)
Basophils Relative: 0 %
Eosinophils Absolute: 0.3 K/uL (ref 0.0–0.5)
Eosinophils Relative: 2 %
HCT: 25.8 % — ABNORMAL LOW (ref 36.0–46.0)
Hemoglobin: 8 g/dL — ABNORMAL LOW (ref 12.0–15.0)
Immature Granulocytes: 1 %
Lymphocytes Relative: 15 %
Lymphs Abs: 2.4 K/uL (ref 0.7–4.0)
MCH: 30.1 pg (ref 26.0–34.0)
MCHC: 31 g/dL (ref 30.0–36.0)
MCV: 97 fL (ref 80.0–100.0)
Monocytes Absolute: 1 K/uL (ref 0.1–1.0)
Monocytes Relative: 6 %
Neutro Abs: 11.9 K/uL — ABNORMAL HIGH (ref 1.7–7.7)
Neutrophils Relative %: 76 %
Platelets: 369 K/uL (ref 150–400)
RBC: 2.66 MIL/uL — ABNORMAL LOW (ref 3.87–5.11)
RDW: 14.9 % (ref 11.5–15.5)
WBC: 15.9 K/uL — ABNORMAL HIGH (ref 4.0–10.5)
nRBC: 0 % (ref 0.0–0.2)

## 2024-11-24 DIAGNOSIS — Z930 Tracheostomy status: Secondary | ICD-10-CM | POA: Diagnosis not present

## 2024-11-24 DIAGNOSIS — J850 Gangrene and necrosis of lung: Secondary | ICD-10-CM | POA: Diagnosis not present

## 2024-11-24 DIAGNOSIS — G931 Anoxic brain damage, not elsewhere classified: Secondary | ICD-10-CM | POA: Diagnosis not present

## 2024-11-24 DIAGNOSIS — I5020 Unspecified systolic (congestive) heart failure: Secondary | ICD-10-CM | POA: Diagnosis not present

## 2024-11-24 DIAGNOSIS — J9621 Acute and chronic respiratory failure with hypoxia: Secondary | ICD-10-CM | POA: Diagnosis not present

## 2024-11-25 DIAGNOSIS — G931 Anoxic brain damage, not elsewhere classified: Secondary | ICD-10-CM | POA: Diagnosis not present

## 2024-11-25 DIAGNOSIS — Z930 Tracheostomy status: Secondary | ICD-10-CM | POA: Diagnosis not present

## 2024-11-25 DIAGNOSIS — I5020 Unspecified systolic (congestive) heart failure: Secondary | ICD-10-CM | POA: Diagnosis not present

## 2024-11-25 DIAGNOSIS — J9621 Acute and chronic respiratory failure with hypoxia: Secondary | ICD-10-CM | POA: Diagnosis not present

## 2024-11-25 DIAGNOSIS — J850 Gangrene and necrosis of lung: Secondary | ICD-10-CM | POA: Diagnosis not present

## 2024-11-25 LAB — CBC
HCT: 25.9 % — ABNORMAL LOW (ref 36.0–46.0)
Hemoglobin: 8.1 g/dL — ABNORMAL LOW (ref 12.0–15.0)
MCH: 30.3 pg (ref 26.0–34.0)
MCHC: 31.3 g/dL (ref 30.0–36.0)
MCV: 97 fL (ref 80.0–100.0)
Platelets: 373 K/uL (ref 150–400)
RBC: 2.67 MIL/uL — ABNORMAL LOW (ref 3.87–5.11)
RDW: 14.7 % (ref 11.5–15.5)
WBC: 12.3 K/uL — ABNORMAL HIGH (ref 4.0–10.5)
nRBC: 0.3 % — ABNORMAL HIGH (ref 0.0–0.2)

## 2024-11-25 LAB — BASIC METABOLIC PANEL WITH GFR
Anion gap: 7 (ref 5–15)
BUN: 32 mg/dL — ABNORMAL HIGH (ref 8–23)
CO2: 31 mmol/L (ref 22–32)
Calcium: 8.9 mg/dL (ref 8.9–10.3)
Chloride: 100 mmol/L (ref 98–111)
Creatinine, Ser: 0.49 mg/dL (ref 0.44–1.00)
GFR, Estimated: >60 mL/min
Glucose, Bld: 134 mg/dL — ABNORMAL HIGH (ref 70–99)
Potassium: 3.8 mmol/L (ref 3.5–5.1)
Sodium: 138 mmol/L (ref 135–145)

## 2024-11-26 LAB — CULTURE, RESPIRATORY W GRAM STAIN

## 2024-11-27 LAB — CULTURE, BLOOD (ROUTINE X 2): Culture: NO GROWTH
